# Patient Record
Sex: Female | Born: 1990 | Race: Black or African American | Hispanic: No | Marital: Single | Smoking: Never smoker
Health system: Southern US, Community
[De-identification: ages and names within clinical notes are randomized; demographics above are authoritative.]

## PROBLEM LIST (undated history)

## (undated) DIAGNOSIS — K219 Gastro-esophageal reflux disease without esophagitis: Secondary | ICD-10-CM

## (undated) DIAGNOSIS — F32A Depression, unspecified: Secondary | ICD-10-CM

## (undated) DIAGNOSIS — R519 Headache, unspecified: Secondary | ICD-10-CM

## (undated) DIAGNOSIS — N390 Urinary tract infection, site not specified: Secondary | ICD-10-CM

## (undated) DIAGNOSIS — F329 Major depressive disorder, single episode, unspecified: Secondary | ICD-10-CM

## (undated) DIAGNOSIS — G43909 Migraine, unspecified, not intractable, without status migrainosus: Secondary | ICD-10-CM

## (undated) DIAGNOSIS — L930 Discoid lupus erythematosus: Secondary | ICD-10-CM

## (undated) DIAGNOSIS — R51 Headache: Secondary | ICD-10-CM

## (undated) DIAGNOSIS — R7689 Other specified abnormal immunological findings in serum: Secondary | ICD-10-CM

## (undated) DIAGNOSIS — J45909 Unspecified asthma, uncomplicated: Secondary | ICD-10-CM

## (undated) HISTORY — DX: Headache, unspecified: R51.9

## (undated) HISTORY — DX: Urinary tract infection, site not specified: N39.0

## (undated) HISTORY — DX: Depression, unspecified: F32.A

## (undated) HISTORY — DX: Migraine, unspecified, not intractable, without status migrainosus: G43.909

## (undated) HISTORY — DX: Major depressive disorder, single episode, unspecified: F32.9

## (undated) HISTORY — DX: Gastro-esophageal reflux disease without esophagitis: K21.9

## (undated) HISTORY — DX: Headache: R51

## (undated) HISTORY — PX: SINOSCOPY: SHX187

---

## 2012-03-02 DIAGNOSIS — J4520 Mild intermittent asthma, uncomplicated: Secondary | ICD-10-CM | POA: Insufficient documentation

## 2012-03-02 DIAGNOSIS — J45909 Unspecified asthma, uncomplicated: Secondary | ICD-10-CM | POA: Insufficient documentation

## 2014-11-17 DIAGNOSIS — J4541 Moderate persistent asthma with (acute) exacerbation: Secondary | ICD-10-CM | POA: Insufficient documentation

## 2014-11-17 DIAGNOSIS — J309 Allergic rhinitis, unspecified: Secondary | ICD-10-CM | POA: Insufficient documentation

## 2014-11-17 DIAGNOSIS — H1010 Acute atopic conjunctivitis, unspecified eye: Secondary | ICD-10-CM | POA: Insufficient documentation

## 2018-03-09 HISTORY — PX: SINUS IRRIGATION: SHX2411

## 2018-08-07 ENCOUNTER — Encounter (HOSPITAL_COMMUNITY): Payer: Self-pay | Admitting: Emergency Medicine

## 2018-08-07 ENCOUNTER — Other Ambulatory Visit: Payer: Self-pay

## 2018-08-07 ENCOUNTER — Emergency Department (HOSPITAL_COMMUNITY): Payer: Self-pay

## 2018-08-07 ENCOUNTER — Emergency Department (HOSPITAL_COMMUNITY)
Admission: EM | Admit: 2018-08-07 | Discharge: 2018-08-07 | Disposition: A | Discharge status: Home / Self Care | Payer: Self-pay | Attending: Emergency Medicine | Admitting: Emergency Medicine

## 2018-08-07 DIAGNOSIS — J189 Pneumonia, unspecified organism: Secondary | ICD-10-CM | POA: Insufficient documentation

## 2018-08-07 DIAGNOSIS — J181 Lobar pneumonia, unspecified organism: Secondary | ICD-10-CM

## 2018-08-07 DIAGNOSIS — J45909 Unspecified asthma, uncomplicated: Secondary | ICD-10-CM | POA: Insufficient documentation

## 2018-08-07 HISTORY — DX: Unspecified asthma, uncomplicated: J45.909

## 2018-08-07 LAB — POC URINE PREG, ED: Preg Test, Ur: NEGATIVE

## 2018-08-07 LAB — INFLUENZA PANEL BY PCR (TYPE A & B)
Influenza A By PCR: NEGATIVE
Influenza B By PCR: NEGATIVE

## 2018-08-07 MED ORDER — DOXYCYCLINE HYCLATE 100 MG PO CAPS: 100.0000 mg | ORAL_CAPSULE | Freq: Two times a day (BID) | ORAL | 0 refills | Status: DC

## 2018-08-07 NOTE — ED Provider Notes (Signed)
[facility] EMERGENCY DEPARTMENT Provider Note   CSN: [id number] Arrival date & time: 08/07/18  7096    History   Chief Complaint Chief Complaint  Patient presents with  . Cough  . Fever    HPI Allison Daniel is a 28 y.o. female.  Who presents emergency department chief complaint of fever and cough.  Patient states that she had a productive cough and URI symptoms last week with associated chest congestion, green phlegm production from coughing, nasal congestion, fatigue.  She states that her symptoms improved however yesterday at the end of the day she began feeling shakes, chills, or cough came back and she has a productive cough again.  She was febrile today with associated body aches.  She denies urinary symptoms abdominal pain nausea vomiting.  She works at an [employer] and states that she is exposed to people who have upper respiratory symptoms all the time right now.     HPI  Past Medical History:  Diagnosis Date  . Asthma     There are no active problems to display for this patient.   Past Surgical History:  Procedure Laterality Date  . SINUS IRRIGATION  03/2018   drained mucous pockets and fixed septum      OB History   No obstetric history on file.      Home Medications    Prior to Admission medications   Not on File    Family History History reviewed. No pertinent family history.  Social History Social History   Tobacco Use  . Smoking status: Never Smoker  . Smokeless tobacco: Never Used  Substance Use Topics  . Alcohol use: Yes    Comment: occasionally  . Drug use: Never     Allergies   Cephalexin   Review of Systems Review of Systems  Ten systems reviewed and are negative for acute change, except as noted in the HPI.   Physical Exam Updated Vital Signs BP 129/81   Pulse (!) 122   Temp 100.2 F (37.9 C) (Oral)   Resp 18   LMP 07/04/2018 (Approximate)   SpO2 97%   Physical Exam Vitals signs and  nursing note reviewed.  Constitutional:      General: She is not in acute distress.    Appearance: She is well-developed. She is not diaphoretic.  HENT:     Head: Normocephalic and atraumatic.  Eyes:     General: No scleral icterus.    Conjunctiva/sclera: Conjunctivae normal.  Neck:     Musculoskeletal: Normal range of motion.  Cardiovascular:     Rate and Rhythm: Normal rate and regular rhythm.     Heart sounds: Normal heart sounds. No murmur. No friction rub. No gallop.   Pulmonary:     Effort: Pulmonary effort is normal. No respiratory distress.     Breath sounds: Normal breath sounds.  Abdominal:     General: Bowel sounds are normal. There is no distension.     Palpations: Abdomen is soft. There is no mass.     Tenderness: There is no abdominal tenderness. There is no guarding.  Skin:    General: Skin is warm and dry.  Neurological:     Mental Status: She is alert and oriented to person, place, and time.  Psychiatric:        Behavior: Behavior normal.      ED Treatments / Results  Labs (all labs ordered are listed, but only abnormal results are displayed) Labs Reviewed  INFLUENZA PANEL  BY PCR (TYPE A & B)  POC URINE PREG, ED    EKG None  Radiology No results found.  Procedures Procedures (including critical care time)  Medications Ordered in ED Medications - No data to display   Initial Impression / Assessment and Plan / ED Course  I have reviewed the triage vital signs and the nursing notes.  Pertinent labs & imaging results that were available during my care of the patient were reviewed by me and considered in my medical decision making (see chart for details).        Patient has been diagnosed with CAP via chest xray. Pt is not ill appearing, immunocompromised, and does not have multiple co morbidities, therefore I feel like the they can be treated as an OP with abx therapy. Pt has been advised to return to the ED if symptoms worsen or they do not  improve. Pt verbalizes understanding and is agreeable with plan.    Final Clinical Impressions(s) / ED Diagnoses   Final diagnoses:  Community acquired pneumonia of left lower lobe of lung [facility])    ED Discharge Orders    None       Arthor Captain, PA-C 08/07/18 2112    Rolan Bucco, MD 08/07/18 2129

## 2018-08-07 NOTE — Discharge Instructions (Addendum)
Contact a health care provider if: °You have a fever. °You are losing sleep because you cannot control your cough with cough medicine. °Get help right away if: °You have worsening shortness of breath. °You have increased chest pain. °Your sickness becomes worse, especially if you are an older adult or have a weakened immune system. °You cough up blood. °

## 2018-08-07 NOTE — ED Notes (Signed)
Patient transported to X-ray 

## 2018-08-07 NOTE — ED Triage Notes (Addendum)
Pt reports a cold last week with green mucous, states she has been coughing ever since but that she started feeling bad again last night and has been having a 100.1 fever. Reports left lower abdominal pain that started this morning, increased urination (no pain with urination) and a sharp pain when she takes a deep breath.  Pt took delsym, 500 mg tylenol 1530.

## 2018-08-07 NOTE — ED Notes (Signed)
Patient verbalizes understanding of discharge instructions. Opportunity for questioning and answers were provided. Armband removed by staff, pt discharged from ED.  

## 2018-08-26 ENCOUNTER — Other Ambulatory Visit: Payer: Self-pay

## 2018-08-26 ENCOUNTER — Encounter: Payer: Self-pay | Admitting: Family Medicine

## 2018-08-26 ENCOUNTER — Ambulatory Visit: Payer: BC Managed Care – PPO | Admitting: Family Medicine

## 2018-08-26 VITALS — BP 98/70 | HR 90 | Temp 98.4°F | Ht 66.0 in | Wt 211.0 lb

## 2018-08-26 DIAGNOSIS — Z7689 Persons encountering health services in other specified circumstances: Secondary | ICD-10-CM | POA: Diagnosis not present

## 2018-08-26 DIAGNOSIS — J189 Pneumonia, unspecified organism: Secondary | ICD-10-CM

## 2018-08-26 DIAGNOSIS — J181 Lobar pneumonia, unspecified organism: Secondary | ICD-10-CM | POA: Diagnosis not present

## 2018-08-26 DIAGNOSIS — J4540 Moderate persistent asthma, uncomplicated: Secondary | ICD-10-CM | POA: Diagnosis not present

## 2018-08-26 DIAGNOSIS — J302 Other seasonal allergic rhinitis: Secondary | ICD-10-CM | POA: Diagnosis not present

## 2018-08-26 MED ORDER — ALBUTEROL SULFATE HFA 108 (90 BASE) MCG/ACT IN AERS: 2.0000 | INHALATION_SPRAY | Freq: Four times a day (QID) | RESPIRATORY_TRACT | 11 refills | Status: DC | PRN

## 2018-08-26 MED ORDER — BUDESONIDE-FORMOTEROL FUMARATE 80-4.5 MCG/ACT IN AERO: 2.0000 | INHALATION_SPRAY | Freq: Two times a day (BID) | RESPIRATORY_TRACT | 11 refills | Status: DC

## 2018-08-26 NOTE — Progress Notes (Signed)
Patient presents to clinic today to establish care.  SUBJECTIVE: PMH: Pt is a 28 yo female with pmh sig for asthma, GERD, H/O depression, migraines.  Pt previously seen in [place], [place] at [facility] by [facility].  CAP: -ED visit for pneumonia on 2/29 -given doxycycline -endorses continued cough, chest muscle soreness, fatigue -denies fever, chills  Asthma: -h/o since childhood -using symbicort inhaler BID.  Has albuterol rescue inhaler -denies current wheezing  Seasonal allergies: -takes Zyrtec prn -Also uses Nettie pot  Allergies: Cephalexin- rash  Past surgical history: Sinus surgery in October 2019  Social history: Patient is single.  Pt has a masters degree.  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  Patient denies alcohol, tobacco, drug use.   Health Maintenance: Immunizations --TB test 2020 PAP -- 12/2017 LMP--08/13/2018  Family medical hx: Mom-Alive, HLD, HTN Dad-alive MGM-deceased, alcohol abuse, drug abuse PGM-alive, arthritis, diabetes   Past Medical History:  Diagnosis Date  . Asthma     Past Surgical History:  Procedure Laterality Date  . SINUS IRRIGATION  03/2018   drained mucous pockets and fixed septum     Current Outpatient Medications on File Prior to Visit  Medication Sig Dispense Refill  . budesonide-formoterol (SYMBICORT) 80-4.5 MCG/ACT inhaler Inhale 2 puffs into the lungs 2 (two) times daily.    . cetirizine (ZYRTEC) 10 MG tablet Take 10 mg by mouth daily.    Maximino [place] IN Inhale into the lungs as needed.    . norgestimate-ethinyl estradiol (ORTHO-CYCLEN,SPRINTEC,PREVIFEM) 0.25-35 MG-MCG tablet Take 1 tablet by mouth daily.     No current facility-administered medications on file prior to visit.     Allergies  Allergen Reactions  . Cephalexin Rash    History reviewed. No pertinent family history.  Social History   Socioeconomic History  .  Marital status: Single    Spouse name: Not on file  . Number of children: Not on file  . Years of education: Not on file  . Highest education level: Not on file  Occupational History  . Not on file  Social Needs  . Financial resource strain: Not on file  . Food insecurity:    Worry: Not on file    Inability: Not on file  . Transportation needs:    Medical: Not on file    Non-medical: Not on file  Tobacco Use  . Smoking status: Never Smoker  . Smokeless tobacco: Never Used  Substance and Sexual Activity  . Alcohol use: Yes    Comment: occasionally  . Drug use: Never  . Sexual activity: Yes  Lifestyle  . Physical activity:    Days per week: Not on file    Minutes per session: Not on file  . Stress: Not on file  Relationships  . Social connections:    Talks on phone: Not on file    Gets together: Not on file    Attends religious service: Not on file    Active member of club or organization: Not on file    Attends meetings of clubs or organizations: Not on file    Relationship status: Not on file  . Intimate partner violence:    Fear of current or ex partner: Not on file    Emotionally abused: Not on file    Physically abused: Not on file    Forced sexual activity: Not on file  Other Topics Concern  . Not on file  Social  History Narrative  . Not on file    ROS General: Denies fever, chills, night sweats, changes in weight, changes in appetite HEENT: Denies headaches, ear pain, changes in vision, rhinorrhea, sore throat  + nasal congestion CV: Denies CP, palpitations, SOB, orthopnea Pulm: Denies SOB, wheezing  + cough productive and dry GI: Denies abdominal pain, nausea, vomiting, diarrhea, constipation GU: Denies dysuria, hematuria, frequency, vaginal discharge Msk: Denies muscle cramps, joint pains Neuro: Denies weakness, numbness, tingling Skin: Denies rashes, bruising Psych: Denies depression, anxiety, hallucinations  BP 98/70 (BP Location: Left Arm, Patient  Position: Sitting, Cuff Size: Large)   Pulse 90   Temp 98.4 F (36.9 C) (Oral)   Ht 5\' 6"  (1.676 m)   Wt 211 lb (95.7 kg)   LMP 08/15/2018 (Exact Date)   SpO2 98%   BMI 34.06 kg/m   Physical Exam Gen. Pleasant, well developed, well-nourished, in NAD HEENT - [place]/AT, PERRL, no scleral icterus, no nasal drainage, pharynx without erythema or exudate.  TMs full b/l. Lungs: no use of accessory muscles, CTAB, no wheezes, rales or rhonchi Cardiovascular: RRR, No r/g/m, no peripheral edema Neuro:  A&Ox3, CN II-XII intact, normal gait Skin:  Warm, dry, intact, no lesions  Recent Results (from the past 2160 hour(s))  POC urine preg, ED (not at [facility])     Status: None   Collection Time: 08/07/18  7:44 PM  Result Value Ref Range   Preg Test, Ur NEGATIVE NEGATIVE    Comment:        THE SENSITIVITY OF THIS METHODOLOGY IS >24 mIU/mL   Influenza panel by PCR (type A & B)     Status: None   Collection Time: 08/07/18  8:21 PM  Result Value Ref Range   Influenza A By PCR NEGATIVE NEGATIVE   Influenza B By PCR NEGATIVE NEGATIVE    Comment: (NOTE) The Xpert Xpress Flu assay is intended as an aid in the diagnosis of  influenza and should not be used as a sole basis for treatment.  This  assay is FDA approved for nasopharyngeal swab specimens only. Nasal  washings and aspirates are unacceptable for Xpert Xpress Flu testing. Performed at [facility], [address] [place], [place] [address]     Assessment/Plan: Moderate persistent asthma without complication  -given handout - Plan: budesonide-formoterol (SYMBICORT) 80-4.5 MCG/ACT inhaler, albuterol (PROVENTIL HFA;VENTOLIN HFA) 108 (90 Base) MCG/ACT inhaler  Pneumonia of left lower lobe due to infectious organism [facility]) -discussed likely disease course -abx complete.  Exam reassuring -continue supportive care for cough -given RTC or ED precautions  Encounter to establish care -We reviewed the PMH, PSH, FH, SH, Meds and Allergies.  -We provided refills for any medications we will prescribe as needed. -We addressed current concerns per orders and patient instructions. -We have asked for records for pertinent exams, studies, vaccines and notes from previous providers. -We have advised patient to follow up per instructions below.  Seasonal allergies -continue zyrtec and netti pot prn  F/u prn  Abbe Amsterdam, MD

## 2018-08-26 NOTE — Patient Instructions (Signed)
Asthma, Adult  Asthma is a long-term (chronic) condition that causes recurrent episodes in which the airways become tight and narrow. The airways are the passages that lead from the nose and mouth down into the lungs. Asthma episodes, also called asthma attacks, can cause coughing, wheezing, shortness of breath, and chest pain. The airways can also fill with mucus. During an attack, it can be difficult to breathe. Asthma attacks can range from minor to life threatening. Asthma cannot be cured, but medicines and lifestyle changes can help control it and treat acute attacks. What are the causes? This condition is believed to be caused by inherited (genetic) and environmental factors, but its exact cause is not known. There are many things that can bring on an asthma attack or make asthma symptoms worse (triggers). Asthma triggers are different for each person. Common triggers include:  Mold.  Dust.  Cigarette smoke.  Cockroaches.  Things that can cause allergy symptoms (allergens), such as animal dander or pollen from trees or grass.  Air pollutants such as household cleaners, wood smoke, smog, or chemical odors.  Cold air, weather changes, and winds (which increase molds and pollen in the air).  Strong emotional expressions such as crying or laughing hard.  Stress.  Certain medicines (such as aspirin) or types of medicines (such as beta-blockers).  Sulfites in foods and drinks. Foods and drinks that may contain sulfites include dried fruit, potato chips, and sparkling grape juice.  Infections or inflammatory conditions such as the flu, a cold, or inflammation of the nasal membranes (rhinitis).  Gastroesophageal reflux disease (GERD).  Exercise or strenuous activity. What are the signs or symptoms? Symptoms of this condition may occur right after asthma is triggered or many hours later. Symptoms include:  Wheezing. This can sound like whistling when you breathe.  Excessive  nighttime or early morning coughing.  Frequent or severe coughing with a common cold.  Chest tightness.  Shortness of breath.  Tiredness (fatigue) with minimal activity. How is this diagnosed? This condition is diagnosed based on:  Your medical history.  A physical exam.  Tests, which may include: ? Lung function studies and pulmonary studies (spirometry). These tests can evaluate the flow of air in your lungs. ? Allergy tests. ? Imaging tests, such as X-rays. How is this treated? There is no cure for this condition, but treatment can help control your symptoms. Treatment for asthma usually involves:  Identifying and avoiding your asthma triggers.  Using medicines to control your symptoms. Generally, two types of medicines are used to treat asthma: ? Controller medicines. These help prevent asthma symptoms from occurring. They are usually taken every day. ? Fast-acting reliever or rescue medicines. These quickly relieve asthma symptoms by widening the narrow and tight airways. They are used as needed and provide short-term relief.  Using supplemental oxygen. This may be needed during a severe episode.  Using other medicines, such as: ? Allergy medicines, such as antihistamines, if your asthma attacks are triggered by allergens. ? Immune medicines (immunomodulators). These are medicines that help control the immune system.  Creating an asthma action plan. An asthma action plan is a written plan for managing and treating your asthma attacks. This plan includes: ? A list of your asthma triggers and how to avoid them. ? Information about when medicines should be taken and when their dosage should be changed. ? Instructions about using a device called a peak flow meter. A peak flow meter measures how well the lungs are working and the   severity of your asthma. It helps you monitor your condition. Follow these instructions at home: Controlling your home environment Control your home  environment in the following ways to help avoid triggers and prevent asthma attacks:  Change your heating and air conditioning filter regularly.  Limit your use of fireplaces and wood stoves.  Get rid of pests (such as roaches and mice) and their droppings.  Throw away plants if you see mold on them.  Clean floors and dust surfaces regularly. Use unscented cleaning products.  Try to have someone else vacuum for you regularly. Stay out of rooms while they are being vacuumed and for a short while afterward. If you vacuum, use a dust mask from a hardware store, a double-layered or microfilter vacuum cleaner bag, or a vacuum cleaner with a HEPA filter.  Replace carpet with wood, tile, or vinyl flooring. Carpet can trap dander and dust.  Use allergy-proof pillows, mattress covers, and box spring covers.  Keep your bedroom a trigger-free room.  Avoid pets and keep windows closed when allergens are in the air.  Wash beddings every week in hot water and dry them in a dryer.  Use blankets that are made of polyester or cotton.  Clean bathrooms and kitchens with bleach. If possible, have someone repaint the walls in these rooms with mold-resistant paint. Stay out of the rooms that are being cleaned and painted.  Wash your hands often with soap and water. If soap and water are not available, use hand sanitizer.  Do not allow anyone to smoke in your home. General instructions  Take over-the-counter and prescription medicines only as told by your health care provider. ? Speak with your health care provider if you have questions about how or when to take the medicines. ? Make note if you are requiring more frequent dosages.  Do not use any products that contain nicotine or tobacco, such as cigarettes and e-cigarettes. If you need help quitting, ask your health care provider. Also, avoid being exposed to secondhand smoke.  Use a peak flow meter as told by your health care provider. Record and  keep track of the readings.  Understand and use the asthma action plan to help minimize, or stop an asthma attack, without needing to seek medical care.  Make sure you stay up to date on your yearly vaccinations as told by your health care provider. This may include vaccines for the flu and pneumonia.  Avoid outdoor activities when allergen counts are high and when air quality is low.  Wear a ski mask that covers your nose and mouth during outdoor winter activities. Exercise indoors on cold days if you can.  Warm up before exercising, and take time for a cool-down period after exercise.  Keep all follow-up visits as told by your health care provider. This is important. Where to find more information  For information about asthma, turn to the Centers for Disease Control and Prevention at www.cdc.gov/asthma/faqs.htm  For air quality information, turn to AirNow at https://airnow.gov/ Contact a health care provider if:  You have wheezing, shortness of breath, or a cough even while you are taking medicine to prevent attacks.  The mucus you cough up (sputum) is thicker than usual.  Your sputum changes from clear or white to yellow, green, gray, or bloody.  Your medicines are causing side effects, such as a rash, itching, swelling, or trouble breathing.  You need to use a reliever medicine more than 2-3 times a week.  Your peak flow reading   is still at 50-79% of your personal best after following your action plan for 1 hour.  You have a fever. Get help right away if:  You are getting worse and do not respond to treatment during an asthma attack.  You are short of breath when at rest or when doing very little physical activity.  You have difficulty eating, drinking, or talking.  You have chest pain or tightness.  You develop a fast heartbeat or palpitations.  You have a bluish color to your lips or fingernails.  You are light-headed or dizzy, or you faint.  Your peak flow  reading is less than 50% of your personal best.  You feel too tired to breathe normally. Summary  Asthma is a long-term (chronic) condition that causes recurrent episodes in which the airways become tight and narrow. These episodes can cause coughing, wheezing, shortness of breath, and chest pain.  Asthma cannot be cured, but medicines and lifestyle changes can help control it and treat acute attacks.  Make sure you understand how to avoid triggers and how and when to use your medicines.  Asthma attacks can range from minor to life threatening. Get help right away if you have an asthma attack and do not respond to treatment with your usual rescue medicines. This information is not intended to replace advice given to you by your health care provider. Make sure you discuss any questions you have with your health care provider. Document Released: 05/26/2005 Document Revised: 06/30/2016 Document Reviewed: 06/30/2016 Elsevier Interactive Patient Education  2019 [employer]  Community-Acquired Pneumonia, Adult Pneumonia is an infection of the lungs. There are different types of pneumonia. One type can develop while a person is in a hospital. A different type, called community-acquired pneumonia, develops in people who are not, or have not recently been, in the hospital or other health care facility. What are the causes?  Pneumonia may be caused by bacteria, viruses, or funguses. Community-acquired pneumonia is often caused by Streptococcus pneumonia bacteria. These bacteria are often passed from one person to another by breathing in droplets from the cough or sneeze of an infected person. What increases the risk? The condition is more likely to develop in:  People who havechronic diseases, such as chronic obstructive pulmonary disease (COPD), asthma, congestive heart failure, cystic fibrosis, diabetes, or kidney disease.  People who haveearly-stage or late-stage HIV.  People who  havesickle cell disease.  People who havehad their spleen removed (splenectomy).  People who havepoor Administrator.  People who havemedical conditions that increase the risk of breathing in (aspirating) secretions their own mouth and nose.  People who havea weakened immune system (immunocompromised).  People who smoke.  People whotravel to areas where pneumonia-causing germs commonly exist.  People whoare around animal habitats or animals that have pneumonia-causing germs, including birds, bats, rabbits, cats, and farm animals. What are the signs or symptoms? Symptoms of this condition include:  Adry cough.  A wet (productive) cough.  Fever.  Sweating.  Chest pain, especially when breathing deeply or coughing.  Rapid breathing or difficulty breathing.  Shortness of breath.  Shaking chills.  Fatigue.  Muscle aches. How is this diagnosed? Your health care provider will take a medical history and perform a physical exam. You may also have other tests, including:  Imaging studies of your chest, including X-rays.  Tests to check your blood oxygen level and other blood gases.  Other tests on blood, mucus (sputum), fluid around your lungs (pleural fluid), and urine. If your  pneumonia is severe, other tests may be done to identify the specific cause of your illness. How is this treated? The type of treatment that you receive depends on many factors, such as the cause of your pneumonia, the medicines you take, and other medical conditions that you have. For most adults, treatment and recovery from pneumonia may occur at home. In some cases, treatment must happen in a hospital. Treatment may include:  Antibiotic medicines, if the pneumonia was caused by bacteria.  Antiviral medicines, if the pneumonia was caused by a virus.  Medicines that are given by mouth or through an IV tube.  Oxygen.  Respiratory therapy. Although rare, treating severe pneumonia may  include:  Mechanical ventilation. This is done if you are not breathing well on your own and you cannot maintain a safe blood oxygen level.  Thoracentesis. This procedureremoves fluid around one lung or both lungs to help you breathe better. Follow these instructions at home:   Take over-the-counter and prescription medicines only as told by your health care provider. ? Only takecough medicine if you are losing sleep. Understand that cough medicine can prevent your body's natural ability to remove mucus from your lungs. ? If you were prescribed an antibiotic medicine, take it as told by your health care provider. Do not stop taking the antibiotic even if you start to feel better.  Sleep in a semi-upright position at night. Try sleeping in a reclining chair, or place a few pillows under your head.  Do not use tobacco products, including cigarettes, chewing tobacco, and e-cigarettes. If you need help quitting, ask your health care provider.  Drink enough water to keep your urine clear or pale yellow. This will help to thin out mucus secretions in your lungs. How is this prevented? There are ways that you can decrease your risk of developing community-acquired pneumonia. Consider getting a pneumococcal vaccine if:  You are older than 28 years of age.  You are older than 28 years of age and are undergoing cancer treatment, have chronic lung disease, or have other medical conditions that affect your immune system. Ask your health care provider if this applies to you. There are different types and schedules of pneumococcal vaccines. Ask your health care provider which vaccination option is best for you. You may also prevent community-acquired pneumonia if you take these actions:  Get an influenza vaccine every year. Ask your health care provider which type of influenza vaccine is best for you.  Go to the dentist on a regular basis.  Wash your hands often. Use hand sanitizer if soap and  water are not available. Contact a health care provider if:  You have a fever.  You are losing sleep because you cannot control your cough with cough medicine. Get help right away if:  You have worsening shortness of breath.  You have increased chest pain.  Your sickness becomes worse, especially if you are an older adult or have a weakened immune system.  You cough up blood. This information is not intended to replace advice given to you by your health care provider. Make sure you discuss any questions you have with your health care provider. Document Released: 05/26/2005 Document Revised: 02/12/2017 Document Reviewed: 09/20/2014 Elsevier Interactive Patient Education  2019 [employer].

## 2018-09-24 ENCOUNTER — Encounter: Payer: Self-pay | Admitting: Family Medicine

## 2018-09-24 ENCOUNTER — Ambulatory Visit (INDEPENDENT_AMBULATORY_CARE_PROVIDER_SITE_OTHER): Payer: BC Managed Care – PPO | Admitting: Family Medicine

## 2018-09-24 ENCOUNTER — Other Ambulatory Visit: Payer: Self-pay

## 2018-09-24 DIAGNOSIS — N760 Acute vaginitis: Secondary | ICD-10-CM

## 2018-09-24 MED ORDER — FLUCONAZOLE 150 MG PO TABS: 150.0000 mg | ORAL_TABLET | Freq: Once | ORAL | 0 refills | Status: AC

## 2018-09-24 NOTE — Progress Notes (Signed)
Virtual Visit via Video Note Pt did not have video enabled on webex.  I connected with Allison Daniel on 09/24/18 at 11:30 AM EDT by a video enabled telemedicine application and verified that I am speaking with the correct person using two identifiers.  Location patient: home Location provider: home office Persons participating in the virtual visit: patient, provider  I discussed the limitations of evaluation and management by telemedicine and the availability of in person appointments. The patient expressed understanding and agreed to proceed.   HPI: Noticed vaginal itching around her vulva 1 wk ago.  Thought it was 2/2 shaving.  Denies dysuria, frequency, urgency, vaginal d/c, lesions.  Tried a cortisone cream which helped some.  Pt states she did notice slight discoloration of her labia minora after using the cream.  Pt endorses unprotected intercourse with her longtime bf. Denies changes in soaps, lotions, detergents.  Pt has been exercising more, but has been showering immediately after.   ROS: See pertinent positives and negatives per HPI.  Past Medical History:  Diagnosis Date  . Asthma   . Depression   . Frequent headaches   . GERD (gastroesophageal reflux disease)   . Migraine   . UTI (urinary tract infection)     Past Surgical History:  Procedure Laterality Date  . SINUS IRRIGATION  03/2018   drained mucous pockets and fixed septum     Family History  Problem Relation Age of Onset  . Hyperlipidemia Mother   . Hypertension Mother   . Alcohol abuse Maternal Grandmother   . Drug abuse Maternal Grandmother   . Arthritis Paternal Grandmother   . Diabetes Paternal Grandmother     SOCIAL HX:    Current Outpatient Medications:  .  albuterol (PROVENTIL HFA;VENTOLIN HFA) 108 (90 Base) MCG/ACT inhaler, Inhale 2 puffs into the lungs every 6 (six) hours as needed for wheezing or shortness of breath., Disp: 1 Inhaler, Rfl: 11 .  budesonide-formoterol (SYMBICORT) 80-4.5  MCG/ACT inhaler, Inhale 2 puffs into the lungs 2 (two) times daily., Disp: 10.2 g, Rfl: 11 .  cetirizine (ZYRTEC) 10 MG tablet, Take 10 mg by mouth daily., Disp: , Rfl:  .  IPRATROPIUM-ALBUTEROL IN, Inhale into the lungs as needed., Disp: , Rfl:  .  norgestimate-ethinyl estradiol (ORTHO-CYCLEN,SPRINTEC,PREVIFEM) 0.25-35 MG-MCG tablet, Take 1 tablet by mouth daily., Disp: , Rfl:   EXAM:  VITALS per patient if applicable:  RR between 12-20 bpm  GENERAL: alert, oriented, appears well and in no acute distress  LUNGS: breathing rate appears normal, no obvious gross SOB, gasping or wheezing  PSYCH/NEURO: pleasant and cooperative, no obvious depression or anxiety, speech and thought processing grossly intact  ASSESSMENT AND PLAN:  Discussed the following assessment and plan:  Acute vaginitis  -discussed hygiene -continue to monitor - Plan: fluconazole (DIFLUCAN) 150 MG tablet  F/u prn   I discussed the assessment and treatment plan with the patient. The patient was provided an opportunity to ask questions and all were answered. The patient agreed with the plan and demonstrated an understanding of the instructions.   The patient was advised to call back or seek an in-person evaluation if the symptoms worsen or if the condition fails to improve as anticipated.  I provided 12 minutes of non-face-to-face time during this encounter.   Deeann Saint, MD

## 2018-10-14 ENCOUNTER — Ambulatory Visit (INDEPENDENT_AMBULATORY_CARE_PROVIDER_SITE_OTHER): Payer: BC Managed Care – PPO | Admitting: Family Medicine

## 2018-10-14 ENCOUNTER — Encounter: Payer: Self-pay | Admitting: Family Medicine

## 2018-10-14 ENCOUNTER — Other Ambulatory Visit: Payer: Self-pay

## 2018-10-14 DIAGNOSIS — N3000 Acute cystitis without hematuria: Secondary | ICD-10-CM

## 2018-10-14 MED ORDER — SULFAMETHOXAZOLE-TRIMETHOPRIM 800-160 MG PO TABS: 1.0000 | ORAL_TABLET | Freq: Two times a day (BID) | ORAL | 0 refills | Status: AC

## 2018-10-14 NOTE — Progress Notes (Signed)
Virtual Visit via Video Note  I connected with Allison Daniel on 10/14/18 at  3:30 PM EDT by a video enabled telemedicine application and verified that I am speaking with the correct person using two identifiers.  Location patient: home Location provider:work or home office Persons participating in the virtual visit: patient, provider  I discussed the limitations of evaluation and management by telemedicine and the availability of in person appointments. The patient expressed understanding and agreed to proceed.   HPI: Pt endorses strong odor to urine, low back pain, lower abdominal pain, and frequency x 2 wks.  Had chills yesterday.  Denies fever, urgency, vaginal d/c, constipation, n/v, recent sexual intercourse.  LMP was last wk.  Pt started taking a probiotic, cranberry juice, and increasing intake of water to 3-4 bottles per day.  Pt on Ortho-cyclen OCPs.   ROS: See pertinent positives and negatives per HPI.  Past Medical History:  Diagnosis Date  . Asthma   . Depression   . Frequent headaches   . GERD (gastroesophageal reflux disease)   . Migraine   . UTI (urinary tract infection)     Past Surgical History:  Procedure Laterality Date  . SINUS IRRIGATION  03/2018   drained mucous pockets and fixed septum     Family History  Problem Relation Age of Onset  . Hyperlipidemia Mother   . Hypertension Mother   . Alcohol abuse Maternal Grandmother   . Drug abuse Maternal Grandmother   . Arthritis Paternal Grandmother   . Diabetes Paternal Grandmother     SOCIAL HX:    Current Outpatient Medications:  .  albuterol (PROVENTIL HFA;VENTOLIN HFA) 108 (90 Base) MCG/ACT inhaler, Inhale 2 puffs into the lungs every 6 (six) hours as needed for wheezing or shortness of breath., Disp: 1 Inhaler, Rfl: 11 .  budesonide-formoterol (SYMBICORT) 80-4.5 MCG/ACT inhaler, Inhale 2 puffs into the lungs 2 (two) times daily., Disp: 10.2 g, Rfl: 11 .  cetirizine (ZYRTEC) 10 MG tablet, Take 10  mg by mouth daily., Disp: , Rfl:  .  IPRATROPIUM-ALBUTEROL IN, Inhale into the lungs as needed., Disp: , Rfl:  .  norgestimate-ethinyl estradiol (ORTHO-CYCLEN,SPRINTEC,PREVIFEM) 0.25-35 MG-MCG tablet, Take 1 tablet by mouth daily., Disp: , Rfl:   EXAM:  VITALS per patient if applicable: RR between 12-20 bpm, afebrile.  GENERAL: alert, oriented, appears well and in no acute distress  HEENT: atraumatic, conjunctiva clear, no obvious abnormalities on inspection of external nose and ears  NECK: normal movements of the head and neck  LUNGS: on inspection no signs of respiratory distress, breathing rate appears normal, no obvious gross SOB, gasping or wheezing  CV: no obvious cyanosis  MS: moves all visible extremities without noticeable abnormality  PSYCH/NEURO: pleasant and cooperative, no obvious depression or anxiety, speech and thought processing grossly intact  ASSESSMENT AND PLAN:  Discussed the following assessment and plan:  Acute cystitis without hematuria  -given symptoms suspect UTI.  Unable to obtain urine sample at this time.  Discussed limitation of not obtaining UA and UCx. -encouraged to increase intake of water and fluids. -h/o cephalexin allergy-rash -will start Bactrim.  Pt to monitor for S/Es. - Plan: sulfamethoxazole-trimethoprim (BACTRIM DS) 800-160 MG tablet -given precautions -f/u prn   I discussed the assessment and treatment plan with the patient. The patient was provided an opportunity to ask questions and all were answered. The patient agreed with the plan and demonstrated an understanding of the instructions.   The patient was advised to call back or seek  an in-person evaluation if the symptoms worsen or if the condition fails to improve as anticipated.  I provided 11 minutes of non-face-to-face time during this encounter.   Deeann Saint, MD

## 2018-10-25 ENCOUNTER — Ambulatory Visit: Payer: BC Managed Care – PPO | Admitting: Family Medicine

## 2018-10-27 ENCOUNTER — Other Ambulatory Visit (HOSPITAL_COMMUNITY)
Admission: RE | Admit: 2018-10-27 | Discharge: 2018-10-27 | Disposition: A | Discharge status: Home / Self Care | Payer: BC Managed Care – PPO | Source: Ambulatory Visit | Attending: Family Medicine | Admitting: Family Medicine

## 2018-10-27 ENCOUNTER — Ambulatory Visit: Payer: BC Managed Care – PPO | Admitting: Family Medicine

## 2018-10-27 ENCOUNTER — Encounter: Payer: Self-pay | Admitting: Family Medicine

## 2018-10-27 ENCOUNTER — Other Ambulatory Visit: Payer: Self-pay

## 2018-10-27 VITALS — BP 108/78 | HR 105 | Temp 98.2°F | Wt 217.0 lb

## 2018-10-27 DIAGNOSIS — N898 Other specified noninflammatory disorders of vagina: Secondary | ICD-10-CM

## 2018-10-27 DIAGNOSIS — S30814A Abrasion of vagina and vulva, initial encounter: Secondary | ICD-10-CM

## 2018-10-27 NOTE — Progress Notes (Signed)
Subjective:    Patient ID: Allison Daniel, female    DOB: Sep 07, 1990, 28 y.o.   MRN: [id number]  No chief complaint on file.   HPI Patient seen today for acute concern.  Pt seen 4/17 for vaginitis and 5/7 for UTI.  Pt completed diflucan and bactrim respectively.  Pt notes vaginal discomfort to the L of clitoris.  Pt with increase irritation if not using monistat cream or if urinates.  Pt denies vaginal d/c, bumps, tingling, burning, itching, or odor.  Past Medical History:  Diagnosis Date  . Asthma   . Depression   . Frequent headaches   . GERD (gastroesophageal reflux disease)   . Migraine   . UTI (urinary tract infection)     Allergies  Allergen Reactions  . Cephalexin Rash    ROS General: Denies fever, chills, night sweats, changes in weight, changes in appetite HEENT: Denies headaches, ear pain, changes in vision, rhinorrhea, sore throat CV: Denies CP, palpitations, SOB, orthopnea Pulm: Denies SOB, cough, wheezing GI: Denies abdominal pain, nausea, vomiting, diarrhea, constipation GU: Denies dysuria, hematuria, frequency, vaginal discharge  +vaginal discomfort Msk: Denies muscle cramps, joint pains Neuro: Denies weakness, numbness, tingling Skin: Denies rashes, bruising Psych: Denies depression, anxiety, hallucinations      Objective:    Blood pressure 108/78, pulse (!) 105, temperature 98.2 F (36.8 C), temperature source Oral, weight 217 lb (98.4 kg), last menstrual period 10/08/2018, SpO2 98 %.  Gen. Pleasant, well-nourished, in no distress, normal affect   HEENT: [place]/AT, face symmetric, conjunctiva clear, no scleral icterus, PERRLA, nares patent without drainage Lungs: no accessory muscle use Cardiovascular: RRR, no peripheral edema GU: Upon visual inspection: healing, small abrasion of L labia majora near clitoris, otherwise normal external female genitalia.  No vaginal d/c, normal appearing rectum, perineum, and clitoris.  Aptima swab collected Neuro:   A&Ox3, CN II-XII intact, normal gait   Wt Readings from Last 3 Encounters:  10/27/18 217 lb (98.4 kg)  08/26/18 211 lb (95.7 kg)  08/07/18 200 lb (90.7 kg)    No results found for: WBC, HGB, HCT, PLT, GLUCOSE, CHOL, TRIG, HDL, LDLDIRECT, LDLCALC, ALT, AST, NA, K, CL, CREATININE, BUN, CO2, TSH, PSA, INR, GLUF, HGBA1C, MICROALBUR  Assessment/Plan:  Vaginal irritation  - Plan: Cervicovaginal ancillary only  Abrasion of vagina, initial encounter -healing -advised to keep area clean and dry. -consider using A&D ointment as a barrier  F/u prn  Abbe Amsterdam, MD

## 2018-10-28 ENCOUNTER — Encounter: Payer: Self-pay | Admitting: Family Medicine

## 2018-10-28 LAB — CERVICOVAGINAL ANCILLARY ONLY
Bacterial vaginitis: NEGATIVE
Candida vaginitis: NEGATIVE
Chlamydia: NEGATIVE
Neisseria Gonorrhea: NEGATIVE
Trichomonas: NEGATIVE

## 2019-01-26 ENCOUNTER — Other Ambulatory Visit: Payer: Self-pay | Admitting: Family Medicine

## 2019-01-26 DIAGNOSIS — N760 Acute vaginitis: Secondary | ICD-10-CM

## 2019-01-28 NOTE — Telephone Encounter (Signed)
Spoke with pt states that she will try over the counter cream and if not getting better pt will schedule an appointment with Dr Volanda Napoleon

## 2019-02-02 ENCOUNTER — Encounter: Payer: Self-pay | Admitting: Family Medicine

## 2019-02-04 ENCOUNTER — Telehealth: Payer: BC Managed Care – PPO | Admitting: Family Medicine

## 2019-02-08 NOTE — Telephone Encounter (Signed)
Pt appointment  was cancelled 

## 2019-03-08 ENCOUNTER — Telehealth: Payer: BC Managed Care – PPO | Admitting: Physician Assistant

## 2019-03-08 ENCOUNTER — Other Ambulatory Visit: Payer: Self-pay

## 2019-03-08 ENCOUNTER — Telehealth (INDEPENDENT_AMBULATORY_CARE_PROVIDER_SITE_OTHER): Payer: BC Managed Care – PPO | Admitting: Family Medicine

## 2019-03-08 DIAGNOSIS — B379 Candidiasis, unspecified: Secondary | ICD-10-CM

## 2019-03-08 DIAGNOSIS — N3000 Acute cystitis without hematuria: Secondary | ICD-10-CM

## 2019-03-08 DIAGNOSIS — T3695XA Adverse effect of unspecified systemic antibiotic, initial encounter: Secondary | ICD-10-CM | POA: Diagnosis not present

## 2019-03-08 DIAGNOSIS — R350 Frequency of micturition: Secondary | ICD-10-CM

## 2019-03-08 DIAGNOSIS — R399 Unspecified symptoms and signs involving the genitourinary system: Secondary | ICD-10-CM

## 2019-03-08 LAB — POC URINALSYSI DIPSTICK (AUTOMATED)
Glucose, UA: NEGATIVE
Protein, UA: NEGATIVE
Spec Grav, UA: 1.02 (ref 1.010–1.025)
Urobilinogen, UA: NEGATIVE E.U./dL — AB
pH, UA: 7 (ref 5.0–8.0)

## 2019-03-08 MED ORDER — FLUCONAZOLE 150 MG PO TABS: 150.0000 mg | ORAL_TABLET | Freq: Once | ORAL | 0 refills | Status: AC

## 2019-03-08 MED ORDER — SULFAMETHOXAZOLE-TRIMETHOPRIM 800-160 MG PO TABS: 1.0000 | ORAL_TABLET | Freq: Two times a day (BID) | ORAL | 0 refills | Status: AC

## 2019-03-08 NOTE — Progress Notes (Signed)
Virtual Visit via Video Note  I connected with Allison Daniel on 03/08/19 at  3:30 PM EDT by a video enabled telemedicine application 2/2 [id number] pandemic and verified that I am speaking with the correct person using two identifiers.  Location patient: home Location provider:work or home office Persons participating in the virtual visit: patient, provider  I discussed the limitations of evaluation and management by telemedicine and the availability of in person appointments. The patient expressed understanding and agreed to proceed.   HPI: Pt had abdominal pain and back pain and foul smelling urine, seen at UC a few wks ago.  Pt told she had "a severe UTI" in the office.   Started on Cipro 500 mg on 8/26.  Later told to stop the abx after UCx was negative.  Since that visit, pt notes urinary frequency and dull pain in abdomen that started this wknd.  Pt unsure if her menses was contributing to abdominal pain. Pt denies fever, chills, dysuria, N/V, recent sexual activity, changes in soaps.  ROS: See pertinent positives and negatives per HPI.  Past Medical History:  Diagnosis Date  . Asthma   . Depression   . Frequent headaches   . GERD (gastroesophageal reflux disease)   . Migraine   . UTI (urinary tract infection)     Past Surgical History:  Procedure Laterality Date  . SINUS IRRIGATION  03/2018   drained mucous pockets and fixed septum     Family History  Problem Relation Age of Onset  . Hyperlipidemia Mother   . Hypertension Mother   . Alcohol abuse Maternal Grandmother   . Drug abuse Maternal Grandmother   . Arthritis Paternal Grandmother   . Diabetes Paternal Grandmother     Current Outpatient Medications:  .  albuterol (PROVENTIL HFA;VENTOLIN HFA) 108 (90 Base) MCG/ACT inhaler, Inhale 2 puffs into the lungs every 6 (six) hours as needed for wheezing or shortness of breath., Disp: 1 Inhaler, Rfl: 11 .  budesonide-formoterol (SYMBICORT) 80-4.5 MCG/ACT inhaler, Inhale 2  puffs into the lungs 2 (two) times daily., Disp: 10.2 g, Rfl: 11 .  cetirizine (ZYRTEC) 10 MG tablet, Take 10 mg by mouth daily., Disp: , Rfl:  .  IPRATROPIUM-ALBUTEROL IN, Inhale into the lungs as needed., Disp: , Rfl:  .  norgestimate-ethinyl estradiol (ORTHO-CYCLEN,SPRINTEC,PREVIFEM) 0.25-35 MG-MCG tablet, Take 1 tablet by mouth daily., Disp: , Rfl:   EXAM:  VITALS per patient if applicable:  GENERAL: alert, oriented, appears well and in no acute distress  HEENT: atraumatic, conjunctiva clear, no obvious abnormalities on inspection of external nose and ears  NECK: normal movements of the head and neck  LUNGS: on inspection no signs of respiratory distress, breathing rate appears normal, no obvious gross SOB, gasping or wheezing  CV: no obvious cyanosis  MS: moves all visible extremities without noticeable abnormality  PSYCH/NEURO: pleasant and cooperative, no obvious depression or anxiety, speech and thought processing grossly intact  ASSESSMENT AND PLAN:  Discussed the following assessment and plan:  Acute cystitis without hematuria  -UA with 2+ leuks, SG 1.020 -will send for UCx - Plan: sulfamethoxazole-trimethoprim (BACTRIM DS) 800-160 MG tablet -given precautions  Urinary frequency  - Plan: POCT Urinalysis Dipstick (Automated), Urine Culture  Antibiotic-induced yeast infection  - Plan: fluconazole (DIFLUCAN) 150 MG tablet  F/u prn   I discussed the assessment and treatment plan with the patient. The patient was provided an opportunity to ask questions and all were answered. The patient agreed with the plan and demonstrated an understanding of  the instructions.   The patient was advised to call back or seek an in-person evaluation if the symptoms worsen or if the condition fails to improve as anticipated.   Deeann Saint, MD

## 2019-03-08 NOTE — Progress Notes (Signed)
Based on what you shared with me, I feel your condition warrants further evaluation and I recommend that you be seen for a face to face office visit.  NOTE: If you entered your credit card information for this eVisit, you will not be charged. You may see a "hold" on your card for the $35 but that hold will drop off and you will not have a charge processed.  If you are having a true medical emergency please call 911.     For an urgent face to face visit, [facility] has four urgent care centers for your convenience:   . [facility]    [phone]                  Get Driving Directions  [address] [place], [place] [address] . 10 am to 8 pm Monday-Friday . 12 pm to 8 pm Saturday-Sunday   . [facility]  [phone]                  Get Driving Directions  [address], [address] [place], [place] [address] . 8 am to 8 pm Monday-Friday . 9 am to 6 pm Saturday . 11 am to 6 pm Sunday   . [facility] [place]  [phone]                  Get Driving Directions   [address] [place], [place] [address] . 8 am to 8 pm Monday-Friday . 8 am to 4 pm Saturday-Sunday    .  [facility] at [facility]                    Get Driving Directions  [phone]  [address] [place], [place] [address]  . Monday-Friday, 12 PM to 6 PM    Your e-visit answers were reviewed by a board certified advanced clinical practitioner to complete your personal care plan.  Thank you for using e-Visits.  

## 2019-03-10 ENCOUNTER — Encounter: Payer: Self-pay | Admitting: Family Medicine

## 2019-03-10 LAB — URINE CULTURE
MICRO NUMBER:: 934273
SPECIMEN QUALITY:: ADEQUATE

## 2019-03-16 DIAGNOSIS — R87619 Unspecified abnormal cytological findings in specimens from cervix uteri: Secondary | ICD-10-CM | POA: Insufficient documentation

## 2019-03-22 ENCOUNTER — Telehealth: Payer: Self-pay

## 2019-03-22 NOTE — Telephone Encounter (Signed)
Copied from [place] [id number]. Topic: General - Other >> Mar 14, 2019  8:44 AM Lennox Solders wrote: Reason for CRM: please review mychart message pt sent on 03-10-2019. Pt needs medical documentation for her asthma

## 2019-07-19 ENCOUNTER — Ambulatory Visit
Admission: EM | Admit: 2019-07-19 | Discharge: 2019-07-19 | Disposition: A | Discharge status: Home / Self Care | Payer: BC Managed Care – PPO | Attending: Emergency Medicine | Admitting: Emergency Medicine

## 2019-07-19 ENCOUNTER — Encounter: Payer: Self-pay | Admitting: Emergency Medicine

## 2019-07-19 ENCOUNTER — Other Ambulatory Visit: Payer: Self-pay

## 2019-07-19 DIAGNOSIS — R0789 Other chest pain: Secondary | ICD-10-CM | POA: Diagnosis not present

## 2019-07-19 NOTE — Discharge Instructions (Addendum)
Keep symptom log & follow up with PCP in 1-2 weeks.

## 2019-07-19 NOTE — ED Triage Notes (Signed)
Pt presents to [facility] for assessment after yesterday during a meeting at work she had an episode of tachycardia and heart pounding hard that lasted approx 2 minutes.  States then last night when she was laying in bed getting ready to go to sleep she began to feel like her lungs were getting tight and she felt like she couldn't take a deep breath in.  States she took her inhaler and went to sleep, and all night while she was sleeping she felt a heaviness on her chest.  States she had an episode yesterday morning and this morning of chest pain to left and right side of chest with inspiration.  Patient endorses chest pain with inspiration at this time.

## 2019-07-19 NOTE — ED Provider Notes (Signed)
[facility]    CSN: [id number] Arrival date & time: 07/19/19  0846      History   Chief Complaint Chief Complaint  Patient presents with  . Shortness of Breath    HPI Allison Daniel is a 29 y.o. female with history of asthma, GERD presenting for episode of chest discomfort, rapid heart rate, difficulty taking full breath that occurred last night.  Lasted for about 2 minutes without inciting event.  Did not take anything to relieve symptoms.  Patient states this did happen once about a year ago, though did not last as long.  Patient does have history of asthma and allergies: Reports compliance with twice daily Symbicort, nightly nasal.  Tried using inhaler last night which helped a little bit, though did not completely resolve symptoms.  Patient states she was fine at work yesterday, though began to feel little bit of discomfort when going to work this morning.  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  Patient denies history of DVT/PE: No leg swelling, OCP, tobacco use.  Patient does endorse history of anxiety and depression, for which she is been stable on 50 mg daily of Zoloft since this summer.   Past Medical History:  Diagnosis Date  . Asthma   . Depression   . Frequent headaches   . GERD (gastroesophageal reflux disease)   . Migraine   . UTI (urinary tract infection)     There are no problems to display for this patient.   Past Surgical History:  Procedure Laterality Date  . SINUS IRRIGATION  03/2018   drained mucous pockets and fixed septum     OB History   No obstetric history on file.      Home Medications    Prior to Admission medications   Medication Sig Start Date End Date Taking? Authorizing Provider  sertraline (ZOLOFT) 50 MG tablet Take 50 mg by mouth daily.   Yes [provider]  albuterol (PROVENTIL HFA;VENTOLIN HFA) 108 (90 Base) MCG/ACT inhaler Inhale 2 puffs into the lungs every 6 (six)  hours as needed for wheezing or shortness of breath. 08/26/18   Deeann Saint, MD  budesonide-formoterol (SYMBICORT) 80-4.5 MCG/ACT inhaler Inhale 2 puffs into the lungs 2 (two) times daily. 08/26/18   Deeann Saint, MD  cetirizine (ZYRTEC) 10 MG tablet Take 10 mg by mouth daily.    [provider]  IPRATROPIUM-ALBUTEROL IN Inhale into the lungs as needed.    [provider]  norgestimate-ethinyl estradiol (ORTHO-CYCLEN,SPRINTEC,PREVIFEM) 0.25-35 MG-MCG tablet Take 1 tablet by mouth daily.    [provider]    Family History Family History  Problem Relation Age of Onset  . Hyperlipidemia Mother   . Hypertension Mother   . Alcohol abuse Maternal Grandmother   . Drug abuse Maternal Grandmother   . Arthritis Paternal Grandmother   . Diabetes Paternal Grandmother     Social History Social History   Tobacco Use  . Smoking status: Never Smoker  . Smokeless tobacco: Never Used  Substance Use Topics  . Alcohol use: Yes    Comment: occasionally  . Drug use: Never     Allergies   Cephalexin   Review of Systems As per HPI   Physical Exam Triage Vital Signs ED Triage Vitals  Enc Vitals Group     BP      Pulse      Resp      Temp  Temp src      SpO2      Weight      Height      Head Circumference      Peak Flow      Pain Score      Pain Loc      Pain Edu?      Excl. in GC?    No data found.  Updated Vital Signs BP 125/85 (BP Location: Left Arm)   Pulse 73   Temp 98.2 F (36.8 C) (Temporal)   Resp 16   LMP 06/18/2019   SpO2 98%   Visual Acuity Right Eye Distance:   Left Eye Distance:   Bilateral Distance:    Right Eye Near:   Left Eye Near:    Bilateral Near:     Physical Exam Constitutional:      General: She is not in acute distress.    Appearance: She is obese. She is not ill-appearing or diaphoretic.  HENT:     Head: Normocephalic and atraumatic.     Mouth/Throat:     Mouth: Mucous membranes are moist.      Pharynx: Oropharynx is clear. No oropharyngeal exudate or posterior oropharyngeal erythema.  Eyes:     General: No scleral icterus.    Conjunctiva/sclera: Conjunctivae normal.     Pupils: Pupils are equal, round, and reactive to light.  Neck:     Comments: Trachea midline, negative JVD Cardiovascular:     Rate and Rhythm: Normal rate and regular rhythm.     Heart sounds: No murmur. No gallop.   Pulmonary:     Effort: Pulmonary effort is normal. No respiratory distress.     Breath sounds: No wheezing, rhonchi or rales.  Musculoskeletal:     Cervical back: Neck supple. No tenderness.  Lymphadenopathy:     Cervical: No cervical adenopathy.  Skin:    Capillary Refill: Capillary refill takes less than 2 seconds.     Coloration: Skin is not jaundiced or pale.     Findings: No rash.  Neurological:     General: No focal deficit present.     Mental Status: She is alert and oriented to person, place, and time.      UC Treatments / Results  Labs (all labs ordered are listed, but only abnormal results are displayed) Labs Reviewed - No data to display  EKG   Radiology No results found.  Procedures Procedures (including critical care time)  Medications Ordered in UC Medications - No data to display  Initial Impression / Assessment and Plan / UC Course  I have reviewed the triage vital signs and the nursing notes.  Pertinent labs & imaging results that were available during my care of the patient were reviewed by me and considered in my medical decision making (see chart for details).     Patient afebrile, nontoxic, with SpO2 98%.  EKG done in office, reviewed by me without previous to compare: NSR with ventricular rate 69 bpm.  No QTC prolongation, ST elevation or depression - unremarkable EKG.  Reviewed findings with patient who verbalized understanding.  Covid testing deferred at this time.  Will have patient keep symptom log, follow-up with PCP in 1 to 2 weeks for repeat  evaluation.  We will continue all home medications, keep symptom log in the interim.  Tachycardia last night could have been result of inhaler use/anxiety component to chest discomfort.  Return precautions discussed, patient verbalized understanding and is agreeable to plan. Final Clinical Impressions(s) /  UC Diagnoses   Final diagnoses:  None     Discharge Instructions     Keep symptom log & follow up with PCP in 1-2 weeks.     ED Prescriptions    None     PDMP not reviewed this encounter.   Hall[place]Potvin, [place], [place] 07/19/19 [phone]

## 2019-07-19 NOTE — ED Notes (Signed)
Patient able to ambulate independently  

## 2019-09-14 ENCOUNTER — Encounter: Payer: BC Managed Care – PPO | Admitting: Family Medicine

## 2019-10-12 DIAGNOSIS — F419 Anxiety disorder, unspecified: Secondary | ICD-10-CM | POA: Insufficient documentation

## 2019-10-26 ENCOUNTER — Encounter: Payer: Self-pay | Admitting: Allergy

## 2019-10-26 ENCOUNTER — Other Ambulatory Visit: Payer: Self-pay

## 2019-10-26 ENCOUNTER — Ambulatory Visit: Payer: BC Managed Care – PPO | Admitting: Allergy

## 2019-10-26 VITALS — BP 106/72 | HR 82 | Temp 97.8°F | Resp 16 | Ht 66.0 in | Wt 215.8 lb

## 2019-10-26 DIAGNOSIS — H1013 Acute atopic conjunctivitis, bilateral: Secondary | ICD-10-CM

## 2019-10-26 DIAGNOSIS — J3089 Other allergic rhinitis: Secondary | ICD-10-CM | POA: Diagnosis not present

## 2019-10-26 DIAGNOSIS — J4530 Mild persistent asthma, uncomplicated: Secondary | ICD-10-CM | POA: Diagnosis not present

## 2019-10-26 MED ORDER — BUDESONIDE-FORMOTEROL FUMARATE 160-4.5 MCG/ACT IN AERO: INHALATION_SPRAY | RESPIRATORY_TRACT | 5 refills | Status: DC

## 2019-10-26 MED ORDER — XHANCE 93 MCG/ACT NA EXHU: INHALANT_SUSPENSION | NASAL | 5 refills | Status: DC

## 2019-10-26 MED ORDER — CARBINOXAMINE MALEATE 6 MG PO TABS: ORAL_TABLET | ORAL | 5 refills | Status: DC

## 2019-10-26 NOTE — Progress Notes (Signed)
New Patient Note  RE: Allison Daniel MRN: [id number] DOB: 17-Jul-1990 Date of Office Visit: 10/26/2019  Referring provider: No ref. provider found Primary care provider: Chaney Malling, [place]  Chief Complaint: Asthma and allergies  History of present illness: Allison Daniel is a 29 y.o. female presenting today for evaluation of asthma and environmental allergies.  She has history of asthma.  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  She denies hospitalization for asthma flare as an adult but did not require the ED/hospitalization visits as a child.  She has had ED visits requiring nebulizations as an adult. Triggers include winter/colder months, change in seasons and humidity as well as allergies.  Symptoms include shortness of breath, chest pain, cough, wheeze.   On average will need to use albuterol several times a week.  Does report some coughing in the night but has not required albuterol.  She does take Symbicort 84mcg 2 puffs twice a day.  Does not have a spacer.  Has been on symbicort for past 2-3 years.  Was on singulair in the past and states it was not helping thus was taken off.   With her allergy symptoms she reports sneezing, itchy and watery eyes, itchy throat, nasal congestion.  She has used cetirizine which worked a while and then became ineffective. Has tried claritin, sudafed, Allegra-D and xyzal which all helped minimally.  She was on allergy shots that she started around 4th grade and she was on them until 6th grade and stopped due to cost.  She restarted allergy shots again for a short period of time after that but also couldn't continue due to cost.  Allergy shots did help her allergies however.  She is interested in doing allergy shots again at this time.  She had sinus surgery (turbinate reduction) and deviated septum repair in 03/2018.  She states the surgery did help decrease her degree of  congestion.    No history eczema or food allergy.    Review of systems: Review of Systems  Constitutional: Negative.   HENT:       See HPI  Eyes:       See HPI  Respiratory:       See HPI  Cardiovascular: Negative.   Gastrointestinal: Negative.   Musculoskeletal: Negative.   Skin: Negative.   Neurological: Negative.     All other systems negative unless noted above in HPI  Past medical history: Past Medical History:  Diagnosis Date  . Asthma   . Depression   . Frequent headaches   . GERD (gastroesophageal reflux disease)   . Migraine   . UTI (urinary tract infection)     Past surgical history: Past Surgical History:  Procedure Laterality Date  . SINOSCOPY    . SINUS IRRIGATION  03/2018   drained mucous pockets and fixed septum     Family history:  Family History  Problem Relation Age of Onset  . Hyperlipidemia Mother   . Hypertension Mother   . Allergic rhinitis Mother   . Alcohol abuse Maternal Grandmother   . Drug abuse Maternal Grandmother   . Allergic rhinitis Maternal Grandmother   . Arthritis Paternal Grandmother   . Diabetes Paternal Grandmother   . Angioedema Neg Hx   . Asthma Neg Hx   . Atopy Neg Hx   . Eczema Neg Hx   . Immunodeficiency Neg Hx   . Urticaria  Neg Hx     Social history: Lives in an apartment with carpeting with electric heating and central cooling.  No pets in the home.  No concern for water damage, mildew or roaches in the home.  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 Denies smoking history.   Medication List: Current Outpatient Medications  Medication Sig Dispense Refill  . albuterol (PROVENTIL HFA;VENTOLIN HFA) 108 (90 Base) MCG/ACT inhaler Inhale 2 puffs into the lungs every 6 (six) hours as needed for wheezing or shortness of breath. 1 Inhaler 11  . cetirizine (ZYRTEC) 10 MG tablet Take 10 mg by mouth daily.    . sertraline (ZOLOFT) 50 MG tablet Take 50 mg by mouth daily.    .  budesonide-formoterol (SYMBICORT) 160-4.5 MCG/ACT inhaler 2 puffs two times daily with spacer 1 Inhaler 5  . Carbinoxamine Maleate (RYVENT) 6 MG TABS 1 tablet two times daily 60 tablet 5  . Fluticasone Propionate (XHANCE) 93 MCG/ACT EXHU 2 sprays each nostril twice a day 32 mL 5   No current facility-administered medications for this visit.    Known medication allergies: Allergies  Allergen Reactions  . Cephalexin Rash     Physical examination: Blood pressure 106/72, pulse 82, temperature 97.8 F (36.6 C), temperature source Temporal, resp. rate 16, height 5\' 6"  (1.676 m), weight 215 lb 12.8 oz (97.9 kg), SpO2 97 %.  General: Alert, interactive, in no acute distress. HEENT: PERRLA, TMs pearly gray, turbinates markedly edematous and pale with clear discharge, post-pharynx non erythematous. Neck: Supple without lymphadenopathy. Lungs: Clear to auscultation without wheezing, rhonchi or rales. {no increased work of breathing. CV: Normal S1, S2 without murmurs. Abdomen: Nondistended, nontender. Skin: Warm and dry, without lesions or rashes. Extremities:  No clubbing, cyanosis or edema. Neuro:   Grossly intact.  Diagnositics/Labs:  Spirometry: FEV1: 2.41L 81%, FVC: 2.89L 83%, ratio consistent with nonobstructive pattern  Allergy testing: environmental allergy skin prick testing is positive to grass pollens, weeds pollens, tree pollens, molds, cat hair, cockroach.   Intradermal testing is positive to dog and mite mix.   Allergy testing results were read and interpreted by provider, documented by clinical staff.   Assessment and plan: Allergic rhinitis with conjunctivitis   -Environmental allergy skin testing today is positive to tree pollens, grass pollens, weed pollens, molds, cat, cockroach, dog and dust mite  -Allergen avoidance measures discussed/handouts provided  -Recommend trial of RyVent 6 mg take 1 tablet twice a day.  This is a prescription based antihistamine  -Continue  nasal saline rinses.  Use distilled water or boiled water and bring down to room temperature prior to use.  Breathe through your mouth through the entire process.  Then follow-up use with your medicated nasal spray.   -Recommend use of nasal Afrin (oxymetazoline) 2 sprays each nostril and wait 5 to 15 minutes or until you can breathe more freely through your nose.  Then follow with your medicated nasal spray as below.  Do not use Afrin more than 3 to 5 days at a time.  -Recommend trial of nasal Xhance which is a special nasal spray device that allows for deeper deposition of the medication to your sinuses.  XHANCE is fluticasone which is the same steroid as in Flonase.  It is in a stronger dose than Flonase.  XHANCE use 2 sprays each nostril twice a day.  -Recommend use of Pataday or Pataday extra strength 1 drop each eye daily as needed for watery, itchy or red eyes.  This  is an over-the-counter eyedrop.  - allergen immunotherapy discussed today including protocol, benefits and risk.  Informational handout provided.  If interested in this therapuetic option you can check with your insurance carrier for coverage.  Let us know if you would like to proceed with this option.    Mild persistent asthma  - have access to albuterol inhaler 2 puffs every 4-6 hours as needed for cough/wheeze/shortness of breath/chest tightness.  May use 15-20 minutes prior to activity.   Monitor frequency of use.    -Increase to Symbicort 160 mcg 2 puffs twice a day.  Use with spacer.  Spacer device provided today.  Asthma control goals:   Full participation in all desired activities (may need albuterol before activity)  Albuterol use two time or less a week on average (not counting use with activity)  Cough interfering with sleep two time or less a month  Oral steroids no more than once a year  No hospitalizations  Follow-up in 3-4 months or sooner if needed  I appreciate the opportunity to take part in Sayler's  care. Please do not hesitate to contact me with questions.  Sincerely,   Margo Aye, MD Allergy/Immunology [facility]

## 2019-10-26 NOTE — Patient Instructions (Addendum)
-  Environmental allergy skin testing today is positive to tree pollens, grass pollens, weed pollens, molds, cat, cockroach, dog and dust mite  -Allergen avoidance measures discussed/handouts provided  -Recommend trial of RyVent 6 mg take 1 tablet twice a day.  This is a prescription based antihistamine  -Continue nasal saline rinses.  Use distilled water or boiled water and bring down to room temperature prior to use.  Breathe through your mouth through the entire process.  Then follow-up use with your medicated nasal spray.   -Recommend use of nasal Afrin (oxymetazoline) 2 sprays each nostril and wait 5 to 15 minutes or until you can breathe more freely through your nose.  Then follow with your medicated nasal spray as below.  Do not use Afrin more than 3 to 5 days at a time.  -Recommend trial of nasal Xhance which is a special nasal spray device that allows for deeper deposition of the medication to your sinuses.  XHANCE is fluticasone which is the same steroid as in Flonase.  It is in a stronger dose than Flonase.  XHANCE use 2 sprays each nostril twice a day.  -Recommend use of Pataday or Pataday extra strength 1 drop each eye daily as needed for watery, itchy or red eyes.  This is an over-the-counter eyedrop.  - allergen immunotherapy discussed today including protocol, benefits and risk.  Informational handout provided.  If interested in this therapuetic option you can check with your insurance carrier for coverage.  Let us know if you would like to proceed with this option.     - have access to albuterol inhaler 2 puffs every 4-6 hours as needed for cough/wheeze/shortness of breath/chest tightness.  May use 15-20 minutes prior to activity.   Monitor frequency of use.    -Increase to Symbicort 160 mcg 2 puffs twice a day.  Use with spacer.  Spacer device provided today.  Asthma control goals:   Full participation in all desired activities (may need albuterol before activity)  Albuterol use two  time or less a week on average (not counting use with activity)  Cough interfering with sleep two time or less a month  Oral steroids no more than once a year  No hospitalizations  Follow-up in 3-4 months or sooner if needed

## 2019-11-23 DIAGNOSIS — F418 Other specified anxiety disorders: Secondary | ICD-10-CM | POA: Insufficient documentation

## 2019-12-28 DIAGNOSIS — R7689 Other specified abnormal immunological findings in serum: Secondary | ICD-10-CM | POA: Insufficient documentation

## 2020-02-02 ENCOUNTER — Ambulatory Visit: Payer: BC Managed Care – PPO | Admitting: Allergy

## 2020-04-09 ENCOUNTER — Other Ambulatory Visit: Payer: Self-pay

## 2020-04-11 ENCOUNTER — Other Ambulatory Visit: Payer: Self-pay | Admitting: *Deleted

## 2020-04-11 MED ORDER — XHANCE 93 MCG/ACT NA EXHU: INHALANT_SUSPENSION | NASAL | 5 refills | Status: DC

## 2020-04-16 ENCOUNTER — Other Ambulatory Visit: Payer: Self-pay

## 2020-05-24 ENCOUNTER — Ambulatory Visit: Payer: BC Managed Care – PPO | Admitting: Allergy

## 2020-06-26 ENCOUNTER — Ambulatory Visit: Payer: BC Managed Care – PPO

## 2020-06-28 ENCOUNTER — Ambulatory Visit: Payer: Self-pay | Attending: Internal Medicine

## 2020-06-28 DIAGNOSIS — Z23 Encounter for immunization: Secondary | ICD-10-CM

## 2020-06-28 NOTE — Progress Notes (Signed)
   Covid-19 Vaccination Clinic  Name:  Allison Daniel    MRN: [id number] DOB: [birth year]  06/28/2020  Ms. Allison Daniel was observed post Covid-19 immunization for 15 minutes without incident. She was provided with Vaccine Information Sheet and instruction to access the V-Safe system.   Ms. Allison Daniel was instructed to call 911 with any severe reactions post vaccine: Marland Kitchen Difficulty breathing  . Swelling of face and throat  . A fast heartbeat  . A bad rash all over body  . Dizziness and weakness   Immunizations Administered    Name Date Dose VIS Date Route   Pfizer COVID-19 Vaccine 06/28/2020  4:42 PM 0.3 mL 03/28/2020 Intramuscular   Manufacturer: [employer], [employer]   Lot: [id number]   NDC: [address]-6

## 2020-07-09 ENCOUNTER — Other Ambulatory Visit: Payer: Self-pay

## 2020-07-12 ENCOUNTER — Encounter: Payer: Self-pay | Admitting: Allergy

## 2020-07-12 ENCOUNTER — Other Ambulatory Visit: Payer: Self-pay

## 2020-07-12 ENCOUNTER — Ambulatory Visit: Payer: BC Managed Care – PPO | Admitting: Allergy

## 2020-07-12 VITALS — BP 120/78 | HR 73 | Temp 97.3°F | Resp 18 | Ht 66.0 in | Wt 221.6 lb

## 2020-07-12 DIAGNOSIS — J3089 Other allergic rhinitis: Secondary | ICD-10-CM | POA: Diagnosis not present

## 2020-07-12 DIAGNOSIS — H1013 Acute atopic conjunctivitis, bilateral: Secondary | ICD-10-CM | POA: Diagnosis not present

## 2020-07-12 DIAGNOSIS — J4540 Moderate persistent asthma, uncomplicated: Secondary | ICD-10-CM

## 2020-07-12 MED ORDER — BUDESONIDE-FORMOTEROL FUMARATE 160-4.5 MCG/ACT IN AERO: INHALATION_SPRAY | RESPIRATORY_TRACT | 5 refills | Status: DC

## 2020-07-12 MED ORDER — ALBUTEROL SULFATE HFA 108 (90 BASE) MCG/ACT IN AERS: 2.0000 | INHALATION_SPRAY | Freq: Four times a day (QID) | RESPIRATORY_TRACT | 11 refills | Status: AC | PRN

## 2020-07-12 MED ORDER — FEXOFENADINE HCL 180 MG PO TABS: 180.0000 mg | ORAL_TABLET | Freq: Every day | ORAL | 2 refills | Status: DC

## 2020-07-12 NOTE — Patient Instructions (Signed)
-  continue avoidance measures for tree pollens, grass pollens, weed pollens, molds, cat, cockroach, dog and dust mite  -try Allegra 180mg  daily.  If does not make you drowsy then can take twice a day if needed  -Continue nasal saline rinses.  Use distilled water or boiled water and bring down to room temperature prior to use.  Breathe through your mouth through the entire process.  Then follow-up use with your medicated nasal spray.  -continue Xhance 2 sprays each nostril twice a day as needed for nasal congestion  -Recommend use of Pataday or Pataday extra strength 1 drop each eye daily as needed for watery, itchy or red eyes.  This is an over-the-counter eyedrop.  - allergen immunotherapy discussed today including protocol, benefits and risk.  Informational handout provided.  If interested in this therapuetic option you can check with your insurance carrier for coverage.  Let know if you would like to proceed with this option.     - have access to albuterol inhaler 2 puffs every 4-6 hours as needed for cough/wheeze/shortness of breath/chest tightness.  May use 15-20 minutes prior to activity.   Monitor frequency of use.    -continue Symbicort 160 mcg 2 puffs once a day.  Use with spacer.  Increase to twice a day is not meeting the below goals or having a flare  Asthma control goals:   Full participation in all desired activities (may need albuterol before activity)  Albuterol use two time or less a week on average (not counting use with activity)  Cough interfering with sleep two time or less a month  Oral steroids no more than once a year  No hospitalizations  Follow-up in 4-6 months or sooner if needed

## 2020-07-12 NOTE — Progress Notes (Signed)
Follow-up Note  RE: [name] MRN: [id number] DOB: 12-25-1990 Date of Office Visit: 07/12/2020   History of present illness: Allison Daniel is a 30 y.o. female presenting today for follow-up of allergic rhinoconjunctivitis as well as asthma.  She was last seen in the office on 10/26/2019 by myself.  She has done well since her last visit without any major health changes, surgeries or hospitalizations.  She has questions today in regards to her antihistamine Allegra use.  She does take Zoloft at night which she states makes her sleepy and she is wondering if she can take Allegra and Zoloft at the same time.  She does state that the antihistamines typically make her drowsy as well.  She has not tried antihistamine to date that has not made her drowsy however she has not tried [employer].  She states the [facility] nasal spray has worked well for her nasal congestion control.  She has plenty that is sent from the company to continue use.  She states she has had some issues with watery itchy eyes but does not have the Pataday to use that is over-the-counter at this time.  With her asthma she does continue to take Symbicort 2 puffs once a day.  She has not required use of her albuterol and definitely has not needed to use it more than twice a week on average.  She has not required any urgent care or ED visits or any systemic steroid needs.  Review of systems: Review of Systems  Constitutional: Negative.   HENT: Negative.   Eyes: Negative.   Respiratory: Negative.   Cardiovascular: Negative.   Gastrointestinal: Negative.   Musculoskeletal: Negative.   Skin: Negative.   Neurological: Negative.     All other systems negative unless noted above in HPI  Past medical/social/surgical/family history have been reviewed and are unchanged unless specifically indicated below.  No changes  Medication List: Current Outpatient Medications  Medication Sig Dispense Refill  . fexofenadine  (ALLEGRA) 180 MG tablet Take 1 tablet (180 mg total) by mouth daily. 30 tablet 2  . Fluticasone Propionate (XHANCE) 93 MCG/ACT EXHU 2 sprays each nostril twice a day 32 mL 5  . sertraline (ZOLOFT) 50 MG tablet Take 50 mg by mouth daily.    Marland Kitchen albuterol (VENTOLIN HFA) 108 (90 Base) MCG/ACT inhaler Inhale 2 puffs into the lungs every 6 (six) hours as needed for wheezing or shortness of breath. 1 each 11  . budesonide-formoterol (SYMBICORT) 160-4.5 MCG/ACT inhaler 2 puffs two times daily with spacer 1 each 5   No current facility-administered medications for this visit.     Known medication allergies: Allergies  Allergen Reactions  . Cephalexin Rash     Physical examination: Blood pressure 120/78, pulse 73, temperature (!) 97.3 F (36.3 C), resp. rate 18, height 5\' 6"  (1.676 m), weight 221 lb 9.8 oz (100.5 kg), SpO2 98 %.  General: Alert, interactive, in no acute distress. HEENT: PERRLA, TMs pearly gray, turbinates mildly edematous without discharge, post-pharynx non erythematous. Neck: Supple without lymphadenopathy. Lungs: Clear to auscultation without wheezing, rhonchi or rales. {no increased work of breathing. CV: Normal S1, S2 without murmurs. Abdomen: Nondistended, nontender. Skin: Warm and dry, without lesions or rashes. Extremities:  No clubbing, cyanosis or edema. Neuro:   Grossly intact.  Diagnositics/Labs: None today  Assessment and plan: Allergic rhinitis with conjunctivitis   -continue avoidance measures for tree pollens, grass pollens, weed pollens, molds, cat, cockroach, dog and dust mite  -try Allegra 180mg   daily.  If does not make you drowsy then can take twice a day if needed  -Continue nasal saline rinses.  Use distilled water or boiled water and bring down to room temperature prior to use.  Breathe through your mouth through the entire process.  Then follow-up use with your medicated nasal spray.  -continue Xhance 2 sprays each nostril twice a day as needed for  nasal congestion  -Recommend use of Pataday or Pataday extra strength 1 drop each eye daily as needed for watery, itchy or red eyes.  This is an over-the-counter eyedrop.  - allergen immunotherapy discussed today including protocol, benefits and risk.  Informational handout provided.  If interested in this therapuetic option you can check with your insurance carrier for coverage.  Let us know if you would like to proceed with this option.    Mild persistent asthma  - have access to albuterol inhaler 2 puffs every 4-6 hours as needed for cough/wheeze/shortness of breath/chest tightness.  May use 15-20 minutes prior to activity.   Monitor frequency of use.   -continue Symbicort 160 mcg 2 puffs once a day.  Use with spacer.  Increase to twice a day is not meeting the below goals or having a flare  Asthma control goals:   Full participation in all desired activities (may need albuterol before activity)  Albuterol use two time or less a week on average (not counting use with activity)  Cough interfering with sleep two time or less a month  Oral steroids no more than once a year  No hospitalizations  Follow-up in 4-6 months or sooner if needed  I appreciate the opportunity to take part in Rhyan's care. Please do not hesitate to contact me with questions.  Sincerely,   Margo Aye, MD Allergy/Immunology [facility]

## 2020-07-25 DIAGNOSIS — D649 Anemia, unspecified: Secondary | ICD-10-CM | POA: Insufficient documentation

## 2020-08-27 ENCOUNTER — Other Ambulatory Visit: Payer: Self-pay | Admitting: *Deleted

## 2020-08-27 MED ORDER — FEXOFENADINE HCL 180 MG PO TABS: 180.0000 mg | ORAL_TABLET | Freq: Every day | ORAL | 2 refills | Status: DC

## 2020-09-24 NOTE — Addendum Note (Signed)
Addended by: Lorrin Mais on: 09/24/2020 02:33 PM   Modules accepted: Orders

## 2020-09-25 DIAGNOSIS — J3081 Allergic rhinitis due to animal (cat) (dog) hair and dander: Secondary | ICD-10-CM | POA: Diagnosis not present

## 2020-09-25 NOTE — Progress Notes (Signed)
Aeroallergen Immunotherapy    Patient Details  Name: Allison Daniel  MRN: [id number]  Date of Birth: 10-18-1990   Order 2 of 2   Vial Label: Mold, mite, cat   0.2 ml (Volume) 1:20 Concentration -- Alternaria alternata  0.2 ml (Volume) 1:10 Concentration -- Aspergillus mix  0.2 ml (Volume) 1:10 Concentration -- Penicillium mix  0.2 ml (Volume) 1:20 Concentration -- Bipolaris sorokiniana  0.2 ml (Volume) 1:20 Concentration -- Drechslera spicifera  0.2 ml (Volume) 1:10 Concentration -- Fusarium moniliforme  0.2 ml (Volume) 1:40 Concentration -- Aureobasidium pullulans  0.2 ml (Volume) 1:40 Concentration -- Epicoccum nigrum  0.5 ml (Volume) 1:10 Concentration -- Cat Hair  0.3 ml (Volume) 1:20 Concentration -- Cockroach, German  0.5 ml (Volume)  AU Concentration -- Mite Mix (DF 5,000 & DP 5,000)    2.9 ml Extract Subtotal  2.1 ml Diluent  5.0 ml Maintenance Total    Final Concentration above is stated in weight/volume (wt/vol). Allergen units (AU/ml) biological units (BAU/ml). The total volume is 5 ml.    Schedule: B   Special Instructions: 1 injection/week

## 2020-09-25 NOTE — Progress Notes (Signed)
VIALS EXP 09-25-21 

## 2020-09-25 NOTE — Progress Notes (Signed)
Aeroallergen Immunotherapy    Patient Details  Name: Natacia Chaisson  MRN: [id number]  Date of Birth: October 01, 1990   Order 1 of 2   Vial Label: Pollen, dog   0.3 ml (Volume) BAU Concentration -- 7 Grass Mix* 100,000 ([address] [place], [place], [place], Perennial [place], RedTop, Sweet Vernal, Timothy)  0.2 ml (Volume) 1:20 Concentration -- [place]  0.3 ml (Volume) BAU Concentration -- [place] 10,000  0.2 ml (Volume) 1:20 Concentration -- Johnson  0.3 ml (Volume) 1:20 Concentration -- Ragweed Mix  0.2 ml (Volume) 1:20 Concentration -- Cocklebur  0.2 ml (Volume) 1:20 Concentration -- Burweed [place]er  0.2 ml (Volume) 1:10 Concentration -- Plantain English  0.5 ml (Volume) 1:20 Concentration -- Weed Mix*  0.5 ml (Volume) 1:20 Concentration -- Eastern 10 Tree Mix (also Sweet Gum)  0.2 ml (Volume) 1:20 Concentration -- Box Elder  0.2 ml (Volume) 1:10 Concentration -- Cedar, red  0.2 ml (Volume) 1:10 Concentration -- Pecan Pollen  0.2 ml (Volume) 1:10 Concentration -- Pine Mix  0.2 ml (Volume) 1:20 Concentration -- Walnut, Black Pollen  0.5 ml (Volume) 1:10 Concentration -- Dog Epithelia    4.4 ml Extract Subtotal  0.6 ml Diluent  5.0 ml Maintenance Total    Final Concentration above is stated in weight/volume (wt/vol). Allergen units (AU/ml) biological units (BAU/ml). The total volume is 5 ml.    Schedule: B   Special Instructions: 1 injection

## 2020-09-26 DIAGNOSIS — J3089 Other allergic rhinitis: Secondary | ICD-10-CM

## 2020-10-12 ENCOUNTER — Other Ambulatory Visit: Payer: Self-pay

## 2020-10-12 ENCOUNTER — Ambulatory Visit (INDEPENDENT_AMBULATORY_CARE_PROVIDER_SITE_OTHER): Payer: BC Managed Care – PPO

## 2020-10-12 DIAGNOSIS — J309 Allergic rhinitis, unspecified: Secondary | ICD-10-CM | POA: Diagnosis not present

## 2020-10-12 MED ORDER — EPINEPHRINE 0.3 MG/0.3ML IJ SOAJ: 0.3000 mg | Freq: Once | INTRAMUSCULAR | 1 refills | Status: AC

## 2020-10-12 NOTE — Progress Notes (Signed)
Immunotherapy   Patient Details  Name: Amanat Hackel MRN: [id number] Date of Birth: 02-14-1991  10/12/2020  Roderic Scarce pt here to restart injections on blue vial Following schedule: b  Frequency:weekly Epi-Pen:yes ad trained in office Consent signed and patient instructions given.   Laury Axon Bobie Kistler 10/12/2020, 9:30 AM

## 2020-10-19 ENCOUNTER — Ambulatory Visit (INDEPENDENT_AMBULATORY_CARE_PROVIDER_SITE_OTHER): Payer: BC Managed Care – PPO

## 2020-10-19 DIAGNOSIS — J309 Allergic rhinitis, unspecified: Secondary | ICD-10-CM | POA: Diagnosis not present

## 2020-10-23 ENCOUNTER — Ambulatory Visit (INDEPENDENT_AMBULATORY_CARE_PROVIDER_SITE_OTHER): Payer: BC Managed Care – PPO | Admitting: *Deleted

## 2020-10-23 DIAGNOSIS — J309 Allergic rhinitis, unspecified: Secondary | ICD-10-CM

## 2020-11-02 ENCOUNTER — Ambulatory Visit (INDEPENDENT_AMBULATORY_CARE_PROVIDER_SITE_OTHER): Payer: BC Managed Care – PPO | Admitting: *Deleted

## 2020-11-02 DIAGNOSIS — J309 Allergic rhinitis, unspecified: Secondary | ICD-10-CM

## 2020-11-08 ENCOUNTER — Ambulatory Visit (INDEPENDENT_AMBULATORY_CARE_PROVIDER_SITE_OTHER): Payer: BC Managed Care – PPO | Admitting: *Deleted

## 2020-11-08 DIAGNOSIS — J309 Allergic rhinitis, unspecified: Secondary | ICD-10-CM | POA: Diagnosis not present

## 2020-11-16 ENCOUNTER — Ambulatory Visit (INDEPENDENT_AMBULATORY_CARE_PROVIDER_SITE_OTHER): Payer: BC Managed Care – PPO | Admitting: *Deleted

## 2020-11-16 DIAGNOSIS — J309 Allergic rhinitis, unspecified: Secondary | ICD-10-CM

## 2020-11-30 ENCOUNTER — Ambulatory Visit (INDEPENDENT_AMBULATORY_CARE_PROVIDER_SITE_OTHER): Payer: BC Managed Care – PPO

## 2020-11-30 DIAGNOSIS — J309 Allergic rhinitis, unspecified: Secondary | ICD-10-CM

## 2020-12-05 ENCOUNTER — Ambulatory Visit (INDEPENDENT_AMBULATORY_CARE_PROVIDER_SITE_OTHER): Payer: BC Managed Care – PPO

## 2020-12-05 DIAGNOSIS — J309 Allergic rhinitis, unspecified: Secondary | ICD-10-CM | POA: Diagnosis not present

## 2020-12-14 ENCOUNTER — Ambulatory Visit (INDEPENDENT_AMBULATORY_CARE_PROVIDER_SITE_OTHER): Payer: BC Managed Care – PPO

## 2020-12-14 DIAGNOSIS — J309 Allergic rhinitis, unspecified: Secondary | ICD-10-CM

## 2020-12-20 ENCOUNTER — Other Ambulatory Visit: Payer: Self-pay | Admitting: Orthopedic Surgery

## 2020-12-20 ENCOUNTER — Ambulatory Visit (INDEPENDENT_AMBULATORY_CARE_PROVIDER_SITE_OTHER): Payer: BC Managed Care – PPO | Admitting: *Deleted

## 2020-12-20 DIAGNOSIS — J309 Allergic rhinitis, unspecified: Secondary | ICD-10-CM

## 2020-12-20 DIAGNOSIS — M5459 Other low back pain: Secondary | ICD-10-CM

## 2020-12-22 ENCOUNTER — Other Ambulatory Visit: Payer: Self-pay

## 2020-12-22 ENCOUNTER — Ambulatory Visit
Admission: RE | Admit: 2020-12-22 | Discharge: 2020-12-22 | Disposition: A | Discharge status: Home / Self Care | Payer: BC Managed Care – PPO | Source: Ambulatory Visit | Attending: Orthopedic Surgery | Admitting: Orthopedic Surgery

## 2020-12-22 DIAGNOSIS — M5459 Other low back pain: Secondary | ICD-10-CM

## 2020-12-27 ENCOUNTER — Telehealth: Payer: Self-pay | Admitting: *Deleted

## 2020-12-27 ENCOUNTER — Ambulatory Visit (INDEPENDENT_AMBULATORY_CARE_PROVIDER_SITE_OTHER): Payer: BC Managed Care – PPO | Admitting: *Deleted

## 2020-12-27 DIAGNOSIS — J309 Allergic rhinitis, unspecified: Secondary | ICD-10-CM

## 2020-12-27 NOTE — Telephone Encounter (Signed)
Error

## 2021-01-10 ENCOUNTER — Ambulatory Visit: Payer: BC Managed Care – PPO | Admitting: Allergy

## 2021-01-11 ENCOUNTER — Ambulatory Visit: Payer: BC Managed Care – PPO | Admitting: Allergy

## 2021-01-11 ENCOUNTER — Other Ambulatory Visit: Payer: Self-pay

## 2021-01-11 ENCOUNTER — Encounter: Payer: Self-pay | Admitting: Allergy

## 2021-01-11 VITALS — BP 118/72 | HR 71 | Temp 98.2°F | Resp 16

## 2021-01-11 DIAGNOSIS — J3089 Other allergic rhinitis: Secondary | ICD-10-CM

## 2021-01-11 DIAGNOSIS — J309 Allergic rhinitis, unspecified: Secondary | ICD-10-CM

## 2021-01-11 DIAGNOSIS — J4540 Moderate persistent asthma, uncomplicated: Secondary | ICD-10-CM | POA: Diagnosis not present

## 2021-01-11 DIAGNOSIS — H1013 Acute atopic conjunctivitis, bilateral: Secondary | ICD-10-CM | POA: Diagnosis not present

## 2021-01-11 NOTE — Progress Notes (Signed)
Follow-up Note  RE: Allison Daniel MRN: [id number] DOB: 1990-08-07 Date of Office Visit: 01/11/2021   History of present illness: Allison Daniel is a 30 y.o. female presenting today for follow-up of allergic rhinitis with conjunctivitis and asthma.  She was last seen in the office on 07/12/2020 by myself.  She has not had any major health changes, surgeries or hospitalizations. She states that her allergies are not well controlled.  She still reports a lot of sneezing.   The Allegra is not working thus she did go back to Zyrtec last week.  She tried Xyzal before and states it does have a long enough effect.  She does state that the [place] helps with congestion control.  She has not needed to use Pataday for itchy watery eyes.  She did start on allergen immunotherapy since her last visit however she still remains in the blue vial.   With her asthma she has not needed to use albuterol.  She does continue to take Symbicort 160 mcg 2 puffs once a day.  She has not needed to increase to twice a day dosing.  She has not required ED or urgent care visits or any systemic steroid needs.  Review of systems: Review of Systems  Constitutional: Negative.   HENT:         See HPI  Eyes: Negative.   Respiratory: Negative.    Cardiovascular: Negative.   Gastrointestinal: Negative.   Musculoskeletal: Negative.   Skin: Negative.   Neurological: Negative.    All other systems negative unless noted above in HPI  Past medical/social/surgical/family history have been reviewed and are unchanged unless specifically indicated below.  No changes  Medication List: Current Outpatient Medications  Medication Sig Dispense Refill   albuterol (VENTOLIN HFA) 108 (90 Base) MCG/ACT inhaler Inhale 2 puffs into the lungs every 6 (six) hours as needed for wheezing or shortness of breath. 1 each 11   budesonide-formoterol (SYMBICORT) 160-4.5 MCG/ACT inhaler 2 puffs two times daily with spacer 1 each 5   Fluticasone  Propionate (XHANCE) 93 MCG/ACT EXHU 2 sprays each nostril twice a day 32 mL 5   sertraline (ZOLOFT) 50 MG tablet Take 50 mg by mouth daily.     No current facility-administered medications for this visit.     Known medication allergies: Allergies  Allergen Reactions   Cephalexin Rash     Physical examination: Blood pressure 118/72, pulse 71, temperature 98.2 F (36.8 C), temperature source Temporal, resp. rate 16, SpO2 97 %.  General: Alert, interactive, in no acute distress. HEENT: PERRLA, TMs pearly gray, turbinates minimally edematous without discharge, post-pharynx non erythematous. Neck: Supple without lymphadenopathy. Lungs: Clear to auscultation without wheezing, rhonchi or rales. {no increased work of breathing. CV: Normal S1, S2 without murmurs. Abdomen: Nondistended, nontender. Skin: Warm and dry, without lesions or rashes. Extremities:  No clubbing, cyanosis or edema. Neuro:   Grossly intact.  Diagnositics/Labs: Spirometry: FEV1: 2.21 L 75%, FVC: 2.6 L 74% predicted.  This ratio is more consistent with restrictive pattern  Assessment and plan: Allergic rhinitis with conjunctivitis -continue avoidance measures for tree pollens, grass pollens, weed pollens, molds, cat, cockroach, dog and dust mite  -let us know if Zyrtec is working for you.  If not next antihistamine to try would be Ryvent 6mg  twice a day which is a prescription antihistamine.   Allegra and Xyzal have not been effective  -Continue nasal saline rinses.  Use distilled water or boiled water and bring down to room temperature prior  to use.  Breathe through your mouth through the entire process.  Then follow-up use with your medicated nasal spray.  -continue Xhance 2 sprays each nostril twice a day as needed for nasal congestion  -Recommend use of Pataday or Pataday extra strength 1 drop each eye daily as needed for watery, itchy or red eyes.  This is an over-the-counter eyedrop.  - continue allergy shots per  protocol.  You are in the blue vial which is 3 vials away from red maintenance vial.  Once you are in red vial for 6-12 months then can reassess allergy symptom control  Mild persistent asthma  - have access to albuterol inhaler 2 puffs every 4-6 hours as needed for cough/wheeze/shortness of breath/chest tightness.  May use 15-20 minutes prior to activity.   Monitor frequency of use.    -continue Symbicort 160 mcg 2 puffs once a day.  Use with spacer.  Increase to twice a day is not meeting the below goals or having a flare  Asthma control goals:  Full participation in all desired activities (may need albuterol before activity) Albuterol use two time or less a week on average (not counting use with activity) Cough interfering with sleep two time or less a month Oral steroids no more than once a year No hospitalizations  Follow-up in 6 months or sooner if needed  I appreciate the opportunity to take part in Silas's care. Please do not hesitate to contact me with questions.  Sincerely,   Margo Aye, MD Allergy/Immunology [facility]

## 2021-01-11 NOTE — Patient Instructions (Addendum)
-  continue avoidance measures for tree pollens, grass pollens, weed pollens, molds, cat, cockroach, dog and dust mite  -let us know if Zyrtec is working for you.  If not next antihistamine to try would be Ryvent 6mg  twice a day which is a prescription antihistamine.   Allegra and Xyzal have not been effective  -Continue nasal saline rinses.  Use distilled water or boiled water and bring down to room temperature prior to use.  Breathe through your mouth through the entire process.  Then follow-up use with your medicated nasal spray.  -continue Xhance 2 sprays each nostril twice a day as needed for nasal congestion  -Recommend use of Pataday or Pataday extra strength 1 drop each eye daily as needed for watery, itchy or red eyes.  This is an over-the-counter eyedrop.  - continue allergy shots per protocol.  You are in the blue vial which is 3 vials away from red maintenance vial.  Once you are in red vial for 6-12 months then can reassess allergy symptom control   - have access to albuterol inhaler 2 puffs every 4-6 hours as needed for cough/wheeze/shortness of breath/chest tightness.  May use 15-20 minutes prior to activity.   Monitor frequency of use.    -continue Symbicort 160 mcg 2 puffs once a day.  Use with spacer.  Increase to twice a day is not meeting the below goals or having a flare  Asthma control goals:  Full participation in all desired activities (may need albuterol before activity) Albuterol use two time or less a week on average (not counting use with activity) Cough interfering with sleep two time or less a month Oral steroids no more than once a year No hospitalizations  Follow-up in 6 months or sooner if needed

## 2021-01-24 ENCOUNTER — Ambulatory Visit (INDEPENDENT_AMBULATORY_CARE_PROVIDER_SITE_OTHER): Payer: BC Managed Care – PPO

## 2021-01-24 DIAGNOSIS — J309 Allergic rhinitis, unspecified: Secondary | ICD-10-CM | POA: Diagnosis not present

## 2021-02-01 ENCOUNTER — Ambulatory Visit (INDEPENDENT_AMBULATORY_CARE_PROVIDER_SITE_OTHER): Payer: BC Managed Care – PPO | Admitting: *Deleted

## 2021-02-01 DIAGNOSIS — J309 Allergic rhinitis, unspecified: Secondary | ICD-10-CM

## 2021-02-08 ENCOUNTER — Ambulatory Visit (INDEPENDENT_AMBULATORY_CARE_PROVIDER_SITE_OTHER): Payer: BC Managed Care – PPO

## 2021-02-08 DIAGNOSIS — J309 Allergic rhinitis, unspecified: Secondary | ICD-10-CM

## 2021-02-21 ENCOUNTER — Ambulatory Visit (INDEPENDENT_AMBULATORY_CARE_PROVIDER_SITE_OTHER): Payer: BC Managed Care – PPO | Admitting: *Deleted

## 2021-02-21 DIAGNOSIS — J309 Allergic rhinitis, unspecified: Secondary | ICD-10-CM

## 2021-02-28 ENCOUNTER — Ambulatory Visit (INDEPENDENT_AMBULATORY_CARE_PROVIDER_SITE_OTHER): Payer: BC Managed Care – PPO | Admitting: *Deleted

## 2021-02-28 DIAGNOSIS — J309 Allergic rhinitis, unspecified: Secondary | ICD-10-CM

## 2021-03-07 ENCOUNTER — Ambulatory Visit (INDEPENDENT_AMBULATORY_CARE_PROVIDER_SITE_OTHER): Payer: BC Managed Care – PPO

## 2021-03-07 DIAGNOSIS — J309 Allergic rhinitis, unspecified: Secondary | ICD-10-CM | POA: Diagnosis not present

## 2021-03-21 ENCOUNTER — Ambulatory Visit (INDEPENDENT_AMBULATORY_CARE_PROVIDER_SITE_OTHER): Payer: BC Managed Care – PPO | Admitting: *Deleted

## 2021-03-21 DIAGNOSIS — J309 Allergic rhinitis, unspecified: Secondary | ICD-10-CM | POA: Diagnosis not present

## 2021-04-02 ENCOUNTER — Ambulatory Visit (INDEPENDENT_AMBULATORY_CARE_PROVIDER_SITE_OTHER): Payer: BC Managed Care – PPO | Admitting: *Deleted

## 2021-04-02 DIAGNOSIS — J309 Allergic rhinitis, unspecified: Secondary | ICD-10-CM | POA: Diagnosis not present

## 2021-04-11 ENCOUNTER — Ambulatory Visit (INDEPENDENT_AMBULATORY_CARE_PROVIDER_SITE_OTHER): Payer: BC Managed Care – PPO | Admitting: *Deleted

## 2021-04-11 DIAGNOSIS — J309 Allergic rhinitis, unspecified: Secondary | ICD-10-CM | POA: Diagnosis not present

## 2021-04-18 ENCOUNTER — Ambulatory Visit (INDEPENDENT_AMBULATORY_CARE_PROVIDER_SITE_OTHER): Payer: BC Managed Care – PPO

## 2021-04-18 DIAGNOSIS — J309 Allergic rhinitis, unspecified: Secondary | ICD-10-CM

## 2021-04-30 ENCOUNTER — Ambulatory Visit (INDEPENDENT_AMBULATORY_CARE_PROVIDER_SITE_OTHER): Payer: BC Managed Care – PPO

## 2021-04-30 DIAGNOSIS — J309 Allergic rhinitis, unspecified: Secondary | ICD-10-CM | POA: Diagnosis not present

## 2021-05-09 ENCOUNTER — Ambulatory Visit (INDEPENDENT_AMBULATORY_CARE_PROVIDER_SITE_OTHER): Payer: BC Managed Care – PPO | Admitting: *Deleted

## 2021-05-09 DIAGNOSIS — J309 Allergic rhinitis, unspecified: Secondary | ICD-10-CM

## 2021-06-14 ENCOUNTER — Ambulatory Visit (INDEPENDENT_AMBULATORY_CARE_PROVIDER_SITE_OTHER): Payer: BC Managed Care – PPO

## 2021-06-14 DIAGNOSIS — J309 Allergic rhinitis, unspecified: Secondary | ICD-10-CM

## 2021-07-09 DIAGNOSIS — E559 Vitamin D deficiency, unspecified: Secondary | ICD-10-CM | POA: Insufficient documentation

## 2021-07-17 ENCOUNTER — Other Ambulatory Visit: Payer: Self-pay

## 2021-07-17 ENCOUNTER — Ambulatory Visit: Payer: BC Managed Care – PPO | Admitting: Allergy

## 2021-07-17 ENCOUNTER — Encounter: Payer: Self-pay | Admitting: Allergy

## 2021-07-17 VITALS — BP 120/82 | HR 80 | Temp 97.8°F | Resp 16 | Ht 66.0 in | Wt 244.5 lb

## 2021-07-17 DIAGNOSIS — H1013 Acute atopic conjunctivitis, bilateral: Secondary | ICD-10-CM

## 2021-07-17 DIAGNOSIS — J4530 Mild persistent asthma, uncomplicated: Secondary | ICD-10-CM

## 2021-07-17 DIAGNOSIS — J309 Allergic rhinitis, unspecified: Secondary | ICD-10-CM | POA: Diagnosis not present

## 2021-07-17 DIAGNOSIS — J3089 Other allergic rhinitis: Secondary | ICD-10-CM

## 2021-07-17 NOTE — Progress Notes (Signed)
Follow-up Note  RE: Allison Daniel MRN: [id number] DOB: 1991-04-15 Date of Office Visit: 07/17/2021   History of present illness: Allison Daniel is a 31 y.o. female presenting today for follow-up of allergic rhinitis with conjunctivitis and asthma.  She was last seen in the office on 01/11/2017 by myself. She did get Covid in December with symptoms of shortness of breath, chest tightness, congestion, myalgia, chills, poor appetite, fatigue.   She has to use her albuterol often during this time. She also states did take OTC cough/cold medications.   During fall she was noticing more sneezing, itchy eyes in regards to her allergies.  She does state her allergy shots have been helping with her symptom control.  She continues to do Zyrtec which she states is effective at this time.  She also uses Xhance as needed for nasal congestion.  She has not needed to use any antihistamine based eyedrops lately. She states her asthma usually flares in fall and winter months.  With the changes in the weather it has been driving her asthma symptoms with being cold and then warm again back to cold.  She is using Symbicort 160 mcg 2 puffs once a day at this time.  She has not needed to use her albuterol since she had COVID.  She has not required any systemic steroids or any ED or urgent care visits.  Review of systems: Review of Systems  Constitutional: Negative.   HENT: Negative.    Eyes: Negative.   Respiratory: Negative.    Cardiovascular: Negative.   Gastrointestinal: Negative.   Musculoskeletal: Negative.   Skin: Negative.   Allergic/Immunologic: Negative.   Neurological: Negative.     All other systems negative unless noted above in HPI  Past medical/social/surgical/family history have been reviewed and are unchanged unless specifically indicated below.  No changes  Medication List: Current Outpatient Medications  Medication Sig Dispense Refill   albuterol (VENTOLIN HFA) 108 (90 Base) MCG/ACT  inhaler Inhale 2 puffs into the lungs every 6 (six) hours as needed for wheezing or shortness of breath. 1 each 11   budesonide-formoterol (SYMBICORT) 160-4.5 MCG/ACT inhaler 2 puffs two times daily with spacer 1 each 5   Fluticasone Propionate (XHANCE) 93 MCG/ACT EXHU 2 sprays each nostril twice a day 32 mL 5   sertraline (ZOLOFT) 50 MG tablet Take 50 mg by mouth daily.     No current facility-administered medications for this visit.     Known medication allergies: Allergies  Allergen Reactions   Cephalexin Rash     Physical examination: Blood pressure 120/82, pulse 80, temperature 97.8 F (36.6 C), resp. rate 16, height 5\' 6"  (1.676 m), weight 244 lb 8 oz (110.9 kg), SpO2 98 %.  General: Alert, interactive, in no acute distress. HEENT: PERRLA, TMs pearly gray, turbinates minimally edematous without discharge, post-pharynx non erythematous. Neck: Supple without lymphadenopathy. Lungs: Clear to auscultation without wheezing, rhonchi or rales. {no increased work of breathing. CV: Normal S1, S2 without murmurs. Abdomen: Nondistended, nontender. Skin: Warm and dry, without lesions or rashes. Extremities:  No clubbing, cyanosis or edema. Neuro:   Grossly intact.  Diagnositics/Labs:  Spirometry: FEV1: 2.17 L 74% , FVC: 2.75 L 79% predicted.  Mildly reduced study for age/demographic  Allergy shots provided today in office.  Assessment and plan: Allergic rhinitis with conjunctivitis   -continue avoidance measures for tree pollens, grass pollens, weed pollens, molds, cat, cockroach, dog and dust mite  -continue Zyrtec 10mg  daily as needed.  If Zyrtec becomes ineffective  then would try next Ryvent 6mg  twice a day which is a prescription antihistamine.   Allegra and Xyzal have not been effective  -Continue nasal saline rinses.  Use distilled water or boiled water and bring down to room temperature prior to use.  Breathe through your mouth through the entire process.  Then follow-up use  with your medicated nasal spray.  -use Xhance 2 sprays each nostril twice a day as needed for nasal congestion  -use of Pataday or Pataday extra strength 1 drop each eye daily as needed for watery, itchy or red eyes.  This is an over-the-counter eyedrop.  - continue allergy shots per protocol.   Mild persistent asthma  - have access to albuterol inhaler 2 puffs every 4-6 hours as needed for cough/wheeze/shortness of breath/chest tightness.  May use 15-20 minutes prior to activity.   Monitor frequency of use.    -continue Symbicort 160 mcg 2 puffs once a day.  Use with spacer.  Increase to twice a day if not meeting the below goals or having a flare  Asthma control goals:  Full participation in all desired activities (may need albuterol before activity) Albuterol use two time or less a week on average (not counting use with activity) Cough interfering with sleep two time or less a month Oral steroids no more than once a year No hospitalizations  Follow-up in 6 months or sooner if needed    I appreciate the opportunity to take part in Allison Daniel's care. Please do not hesitate to contact me with questions.  Sincerely,   , MD Allergy/Immunology [facility]

## 2021-07-17 NOTE — Patient Instructions (Signed)
-  continue avoidance measures for tree pollens, grass pollens, weed pollens, molds, cat, cockroach, dog and dust mite  -continue Zyrtec 10mg  daily as needed.  If Zyrtec becomes ineffective then would try next Ryvent 6mg  twice a day which is a prescription antihistamine.   Allegra and Xyzal have not been effective  -Continue nasal saline rinses.  Use distilled water or boiled water and bring down to room temperature prior to use.  Breathe through your mouth through the entire process.  Then follow-up use with your medicated nasal spray.  -use Xhance 2 sprays each nostril twice a day as needed for nasal congestion  -use of Pataday or Pataday extra strength 1 drop each eye daily as needed for watery, itchy or red eyes.  This is an over-the-counter eyedrop.  - continue allergy shots per protocol.    - have access to albuterol inhaler 2 puffs every 4-6 hours as needed for cough/wheeze/shortness of breath/chest tightness.  May use 15-20 minutes prior to activity.   Monitor frequency of use.    -continue Symbicort 160 mcg 2 puffs once a day.  Use with spacer.  Increase to twice a day if not meeting the below goals or having a flare  Asthma control goals:  Full participation in all desired activities (may need albuterol before activity) Albuterol use two time or less a week on average (not counting use with activity) Cough interfering with sleep two time or less a month Oral steroids no more than once a year No hospitalizations  Follow-up in 6 months or sooner if needed

## 2021-08-03 ENCOUNTER — Other Ambulatory Visit: Payer: Self-pay | Admitting: Allergy

## 2021-08-06 ENCOUNTER — Ambulatory Visit: Payer: Self-pay | Admitting: Nurse Practitioner

## 2021-08-13 DIAGNOSIS — J3081 Allergic rhinitis due to animal (cat) (dog) hair and dander: Secondary | ICD-10-CM | POA: Diagnosis not present

## 2021-08-13 NOTE — Progress Notes (Signed)
VIALS EXP 08-14-22 ?

## 2021-08-30 DIAGNOSIS — M533 Sacrococcygeal disorders, not elsewhere classified: Secondary | ICD-10-CM | POA: Insufficient documentation

## 2021-09-04 DIAGNOSIS — G5702 Lesion of sciatic nerve, left lower limb: Secondary | ICD-10-CM | POA: Insufficient documentation

## 2021-09-20 ENCOUNTER — Ambulatory Visit: Payer: BC Managed Care – PPO | Admitting: Nurse Practitioner

## 2022-01-09 ENCOUNTER — Ambulatory Visit: Payer: BC Managed Care – PPO | Admitting: Allergy

## 2022-04-10 ENCOUNTER — Ambulatory Visit: Payer: BC Managed Care – PPO | Admitting: Allergy

## 2022-05-28 ENCOUNTER — Ambulatory Visit: Payer: BC Managed Care – PPO | Admitting: Allergy

## 2022-09-09 IMAGING — MR MR LUMBAR SPINE W/O CM
4 series · 29 of 48 positions shown · non-contrast
Comparison: None available.

CLINICAL DATA: Initial evaluation for low back pain with radiation
into the left lower extremity.

EXAM:
MRI LUMBAR SPINE WITHOUT CONTRAST
TECHNIQUE: Multiplanar, multisequence MR imaging of the lumbar spine was
performed. No intravenous contrast was administered.

[Series 3: T2 · sagittal · 4.0mm · 1.09mm/px · 7 of 17 slices shown (1 of 2)]
[im 1/17]
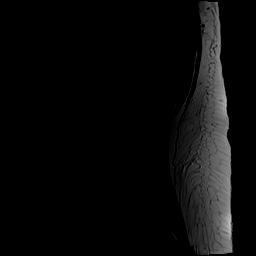
[im 3/17]
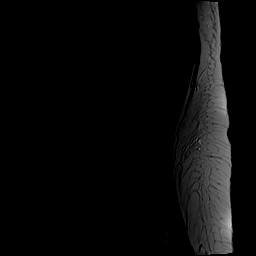
[im 6/17]
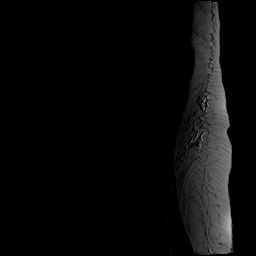
[im 9/17]
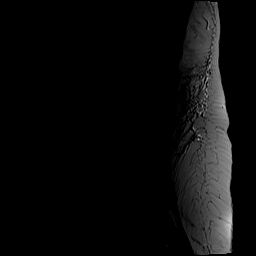
[im 11/17]
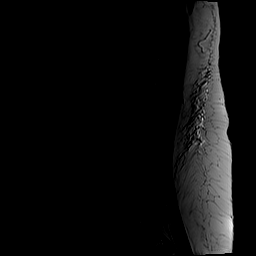
[im 14/17]
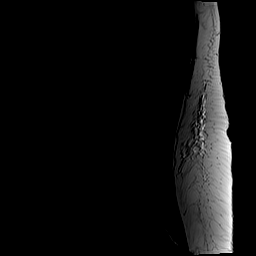
[im 17/17]
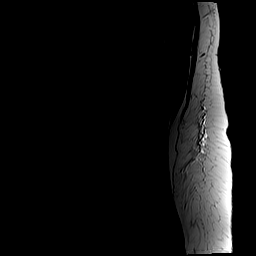

[Series 5: T1 · sagittal · 4.0mm · 1.09mm/px · 7 of 17 slices shown (1 of 2)]
[im 1/17]
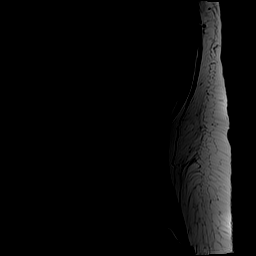
[im 3/17]
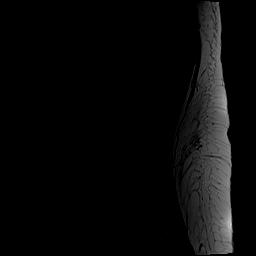
[im 6/17]
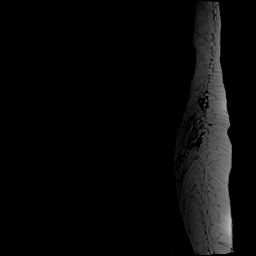
[im 9/17]
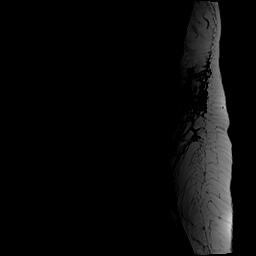
[im 11/17]
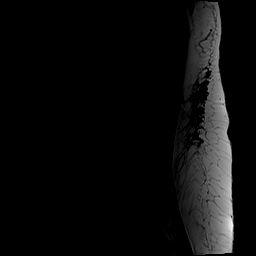
[im 14/17]
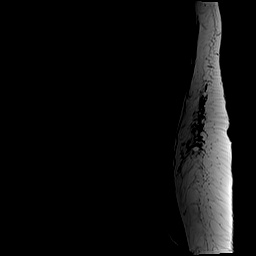
[im 17/17]
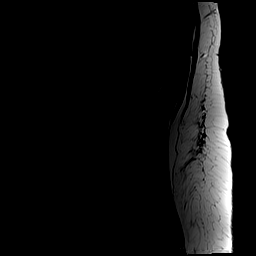

[Series 6: T2 · axial · 4.0mm · 0.39mm/px · z∈[-133,+53]mm · 11 of 42 slices shown (2 of 2)]
[im 3/42]
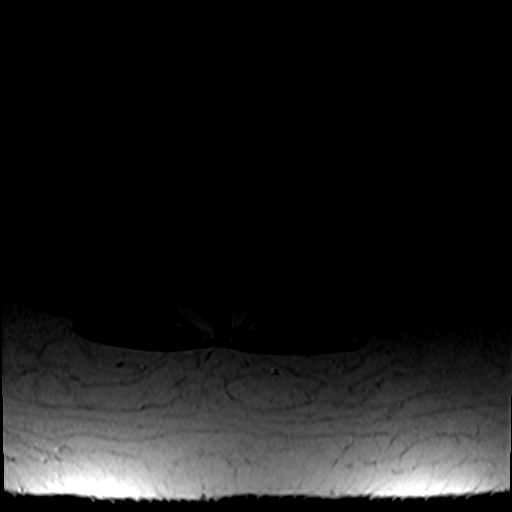
[im 6/42]
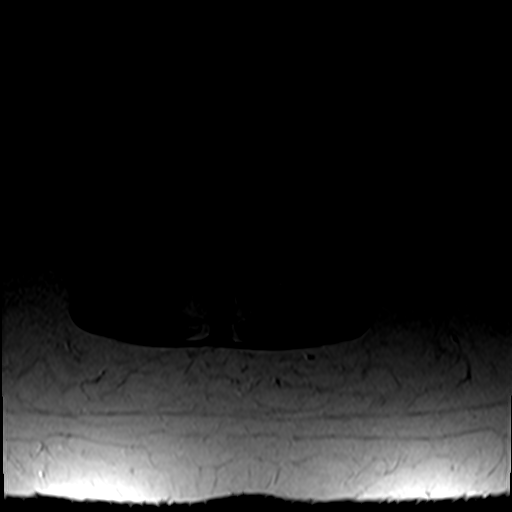
[im 8/42]
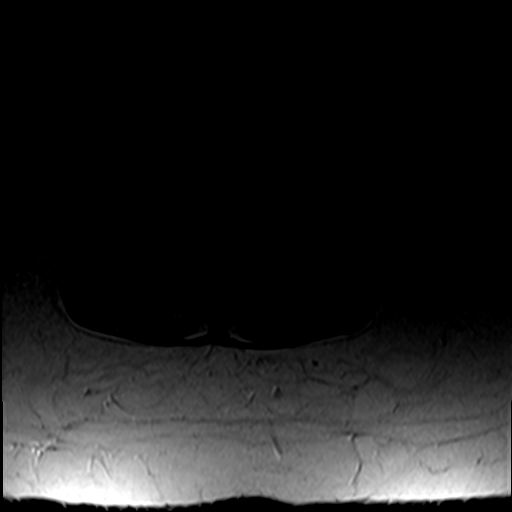
[im 13/42]
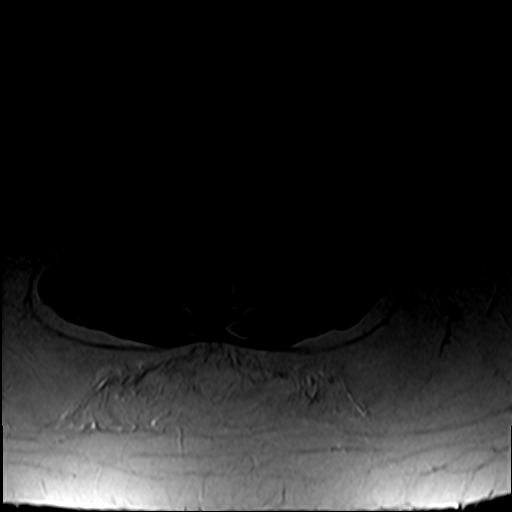
[im 18/42]
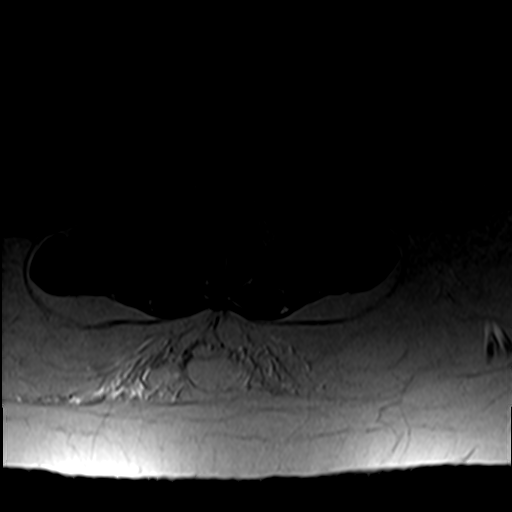
[im 21/42]
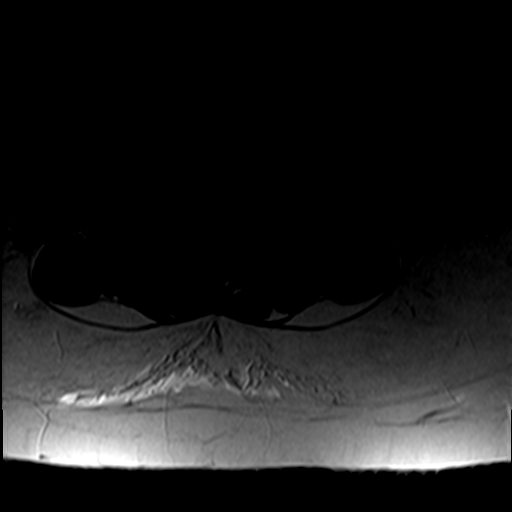
[im 24/42]
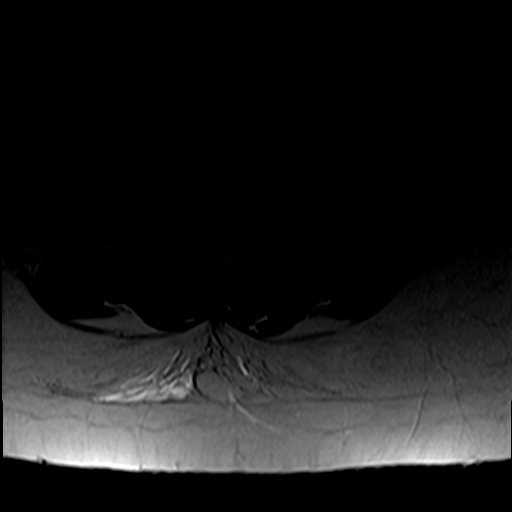
[im 29/42]
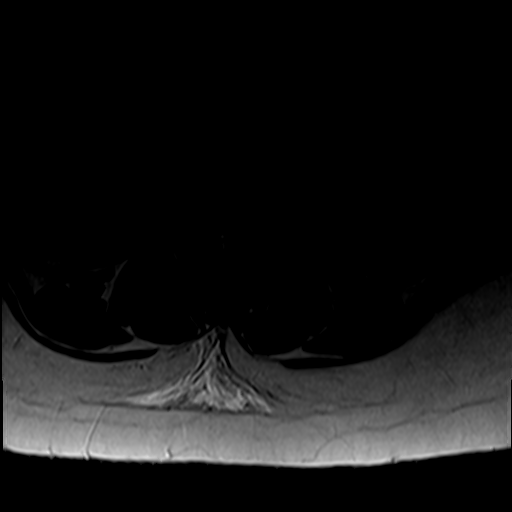
[im 34/42]
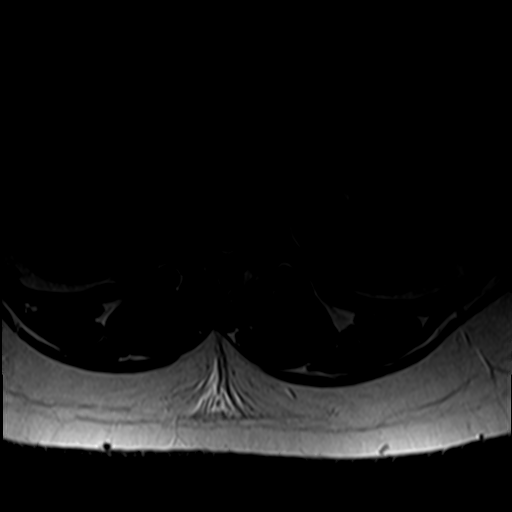
[im 36/42]
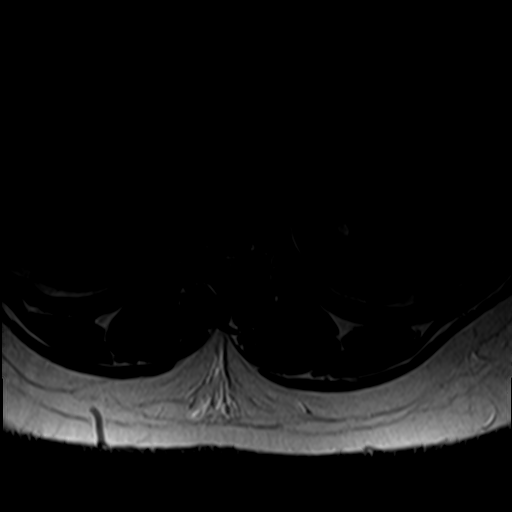
[im 39/42]
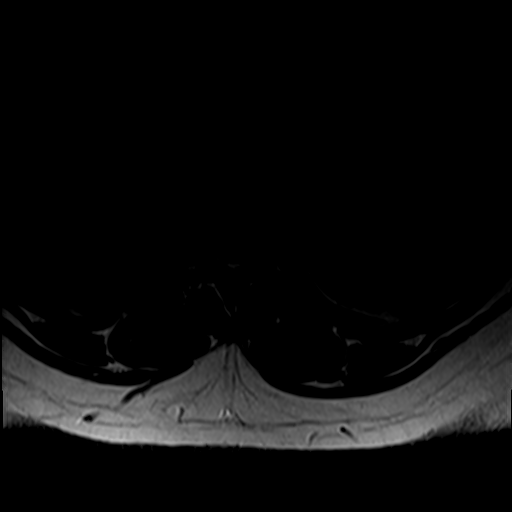

[Series 7: T1 · axial · 4.0mm · 0.39mm/px · z∈[-133,+38]mm · 4 of 42 slices shown (2 of 2)]
[im 3/42]
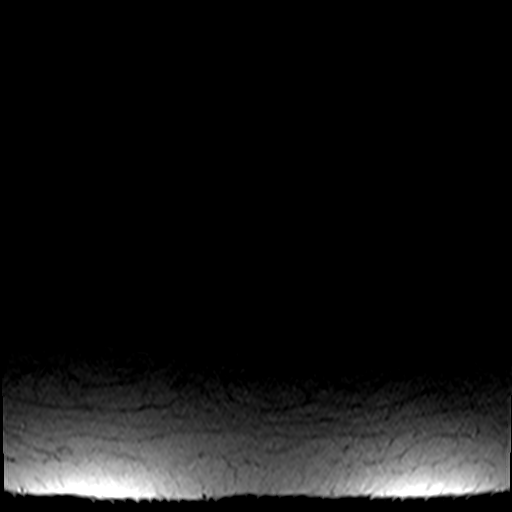
[im 6/42]
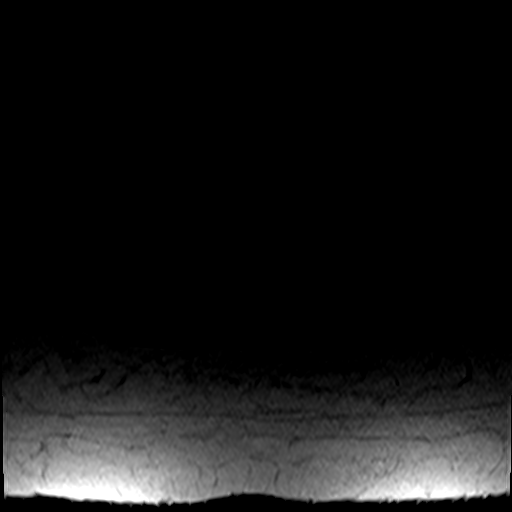
[im 21/42]
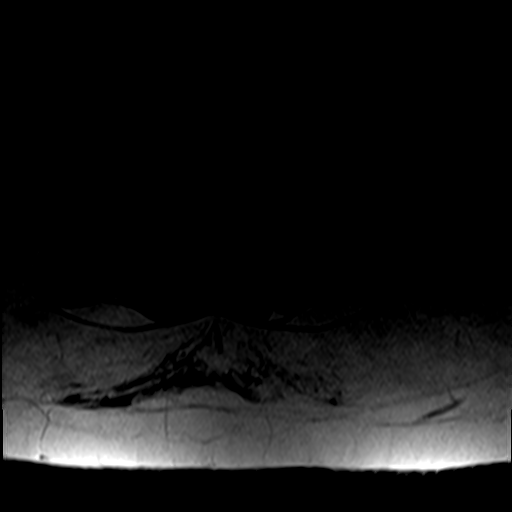
[im 36/42]
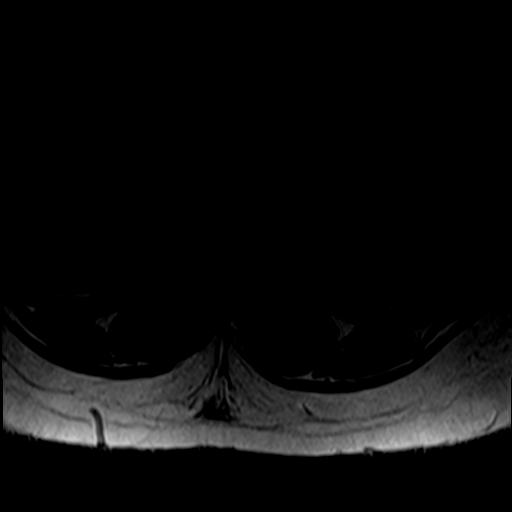

[29 of 48 positions shown; findings below may reference images not displayed]

FINDINGS: Segmentation: Transitional features seen about the lumbosacral
junction. For the purposes of this dictation, lowest well-formed
disc space labeled the L5-S1 level.

Alignment: Mild dextroscoliosis. Alignment otherwise normal with
preservation of the normal lumbar lordosis.

Vertebrae: Vertebral body height maintained without acute or chronic
fracture. Bone marrow signal intensity within normal limits. No
discrete or worrisome osseous lesions. No abnormal marrow edema.

Conus medullaris and cauda equina: Conus extends to the L1-2 level.
Conus and cauda equina appear normal.

Paraspinal and other soft tissues: Unremarkable.

Disc levels:

No significant disc pathology seen within the lumbar spine. No disc
bulge or focal disc herniation. Mild lower lumbar facet hypertrophy
at L4-5 and L5-S1. No significant spinal stenosis. Neural foramina
remain patent. No neural impingement.
IMPRESSION: 1. Mild lower lumbar facet hypertrophy at L4-5 and L5-S1.
2. Otherwise normal MRI of the lumbar spine. No significant disc
pathology, stenosis, or evidence for neural impingement.

## 2024-04-07 ENCOUNTER — Ambulatory Visit: Admission: EM | Admit: 2024-04-07 | Discharge: 2024-04-07 | Disposition: A | Discharge status: Home / Self Care

## 2024-04-07 DIAGNOSIS — R110 Nausea: Secondary | ICD-10-CM | POA: Diagnosis not present

## 2024-04-07 DIAGNOSIS — R079 Chest pain, unspecified: Secondary | ICD-10-CM

## 2024-04-07 DIAGNOSIS — J069 Acute upper respiratory infection, unspecified: Secondary | ICD-10-CM

## 2024-04-07 DIAGNOSIS — R6889 Other general symptoms and signs: Secondary | ICD-10-CM | POA: Diagnosis not present

## 2024-04-07 DIAGNOSIS — M545 Low back pain, unspecified: Secondary | ICD-10-CM

## 2024-04-07 DIAGNOSIS — G8929 Other chronic pain: Secondary | ICD-10-CM

## 2024-04-07 HISTORY — DX: Other specified abnormal immunological findings in serum: R76.89

## 2024-04-07 HISTORY — DX: Discoid lupus erythematosus: L93.0

## 2024-04-07 LAB — POC COVID19/FLU A&B COMBO
Covid Antigen, POC: NEGATIVE
Influenza A Antigen, POC: NEGATIVE
Influenza B Antigen, POC: NEGATIVE

## 2024-04-07 MED ORDER — IBUPROFEN 600 MG PO TABS: 600.0000 mg | ORAL_TABLET | Freq: Three times a day (TID) | ORAL | 0 refills | Status: AC | PRN

## 2024-04-07 MED ORDER — IBUPROFEN 800 MG PO TABS
800.0000 mg | ORAL_TABLET | Freq: Once | ORAL | Status: AC
  Administered 2024-04-07: 800 mg via ORAL

## 2024-04-07 MED ORDER — ONDANSETRON 4 MG PO TBDP: 4.0000 mg | ORAL_TABLET | Freq: Three times a day (TID) | ORAL | 0 refills | Status: AC | PRN

## 2024-04-07 NOTE — Discharge Instructions (Addendum)
 Your EKG today is normal.  The chest pain you are experiencing at this time is most likely due to respiratory illness.  Your rapid influenza and COVID-19 antigen test today was negative.  No further influenza testing is indicated.  Please consider retesting for COVID-19 in the next 2 to 3 days, particularly if you are not feeling any better.  You are welcome to return here to urgent care to repeat the test or you can take a home COVID-19 test.     If both your COVID-19 tests are negative, then you can safely assume that your illness is due to one of the many less serious illnesses circulating in our community right now.     Conservative care is recommended with rest, drinking plenty of clear fluids, eating only when hungry, taking supportive medications for your symptoms and avoiding being around other people.  Please remain at home until you are fever free for 24 hours without the use of antifever medications such as Tylenol and ibuprofen.   Please read below to learn more about the medications, dosages and frequencies that I recommend to help alleviate your symptoms and to get you feeling better soon:   Zyrtec (cetirizine): This is an excellent second-generation antihistamine that helps to reduce respiratory inflammatory response to environmental allergens.  In some patients, this medication can cause daytime sleepiness so I recommend that you take 1 tablet daily at bedtime.                                 Nasonex (mometasone): This is a steroid nasal spray that used once daily, 1 spray in each nare.  This works best when used on a daily basis. This medication does not work well if it is only used when you think you need it.  After 3 to 5 days of use, you will notice significant reduction of the inflammation and mucus production that is currently being caused by exposure to allergens, whether seasonal or environmental.  The most common side effect of this medication is nosebleeds.  If you experience a  nosebleed, please discontinue use for 1 week, then feel free to resume.           ProAir , Ventolin , Proventil  (albuterol ): This inhaled medication contains a short acting beta agonist bronchodilator.  This medication relaxes the smooth muscle of the airway in the lungs.  When these muscles are tight, breathing becomes more constricted.  The result of relaxation of the smooth muscle is increased air movement and improved work of breathing.  This is a short acting medication that can be used every 4-6 hours as needed for increased work of breathing, shortness of breath, wheezing and excessive coughing.  It comes in the form of a handheld inhaler or nebulizer solution.  I recommended that for the next 3 to 4 days, this medication is used 4 times daily on a scheduled basis then decrease to twice daily and as needed until symptoms have completely resolved which I anticipate will be several weeks.        Symbicort  (budesonide  and formoterol ): Please inhale 2 puffs twice daily.  This inhaled medication contains a corticosteroid and long-acting form of albuterol .  The inhaled steroid and this medication  is not absorbed into the body and will not cause side effects such as increased blood sugar levels, irritability, sleeplessness or weight gain.  Inhaled corticosteroid are sort of like topical steroid creams but, as  you can imagine, it is not practical to attempt to rub a steroid cream inside of your lungs.  The long-acting albuterol  works similarly to the short acting albuterol  found in your rescue inhaler but provides 24-hour relaxation of the smooth muscles that open and constrict your airways; your short acting rescue inhaler can only provide for a few hours this benefit for a few hours.  Please feel free to inhale 2 more puffs every 4 hours as needed for shortness of breath, wheezing and cough throughout the day until you are feeling better.      DayQuil Cough DM Plus Congestion liquid:  This is an  over-the-counter multisymptom preparation that contains a cough suppressant, dextromethorphan 20 mg, an expectorant which loosens congestion to make coughing easier, guaifenesin 400 mg, and a decongestant intended to decrease mucous production, phenylephrine 10 mg.  Please keep in mind that the FDA recently stated that phenylephrine has been shown to be ineffective.  My personal recommendation is to avoid combination medications such as this one and to instead choose your own combination of the single symptom relievers listed in this summary.     NyQuil Severe Cold and Flu Liquid, NyQuil VapoCOOL Caplets / Tylenol Cold + Flu + Cough Nighttime Liquid: These are identical over-the-counter multisymptom medications that contain a fever/pain reliever, acetaminophen 650 mg, a cough suppressant, dextromethorphan 30 mg, and an antihistamine commonly found and sleep medications such as Unisom and Sleep-Aid that dries out mucus production and helps you sleep, doxylamine 12.5 mg.  Please do not take this medication along with other cough suppressants with the letters DM or with other antihistamines such as Benadryl, Zyrtec, Xyzal, Allegra  or Claritin to avoid overdose.  Please be careful when taking this medication along with other medications containing acetaminophen or Tylenol.  The maximum dose of Tylenol in a 24-hour period is 3000 mg, taking more than 3000 mg can damage your liver.        Zofran (ondansetron): This is a good antinausea medication that you can take every 8 hours as needed for symptoms of nausea and vomiting.  It is a dissolvable tablet that you do not need to take with water, just put it under your tongue and let it melt away.  I have sent a prescription to your pharmacy.   Advil, Motrin (ibuprofen): This is a good anti-inflammatory medication which addresses aches, pains and inflammation of the upper airways as well as lower back pain.  I recommend that you take  600 mg every 6-8 hours as needed;  your next dose can be taken around 10 PM tonight..  Please do not take more than 2400 mg of ibuprofen in a 24-hour period and please do not take high doses of ibuprofen for more than 3 days in a row as this can lead to stomach ulcers.   If symptoms have not meaningfully improved in the next 5 to 7 days, please return for repeat evaluation or follow-up with your regular provider.  If symptoms have worsened in the next 3 to 5 days, please go to the emergency room for further evaluation.    Thank you for visiting urgent care today.  We appreciate the opportunity to participate in your care.

## 2024-04-07 NOTE — ED Provider Notes (Addendum)
 [facility]    CSN: [id number] Arrival date & time: 04/07/24  1352    HISTORY   Chief Complaint  Patient presents with   Back Pain   Chest Pain   HPI Allison Daniel is a pleasant, 33 y.o. female who presents to urgent care today. Patient states that 6 days ago she began to feel fatigue.  She initially contributed this to the recent weather changes and daily, cold rain we have been having.  Patient states that she had a scratchy throat nasal congestion 6 days ago as well but this improved the next day.  Patient states that throughout the week, she has been having progressively worsening body aches, intermittent coughing, pain in her chest with coughing and taking deep breaths, chills without fever and lower back pain.  Patient states she has been taking DayQuil and giving herself nebulized albuterol  treatments.  States the DayQuil provides temporary relief but the albuterol  is not working well as it normally does.  Patient states that she has Symbicort  at home but was not aware that she was supposed to use it daily.  Patient states she also took Zyrtec and used Nasonex nasal spray last night without meaningful relief.  Patient states that today she began to also feel nauseated and to have shortness of breath with exertion.  Patient denies known history of heart disease but states the pain she is experiencing in her chest at this time feels different than she feels when she is having an asthma flareup.  Patient denies known sick contacts, nausea, vomiting, diarrhea, otalgia, sinus pain or pressure.     The history is provided by the patient.  Back Pain Associated symptoms: chest pain   Chest Pain Associated symptoms: back pain    Past Medical History:  Diagnosis Date   ANA positive    Asthma    Depression    Discoid lupus erythematosus    Frequent headaches    GERD (gastroesophageal reflux disease)    Migraine    UTI (urinary tract infection)    Patient Active Problem  List   Diagnosis Date Noted   Piriformis syndrome of left side 09/04/2021   Sacroiliac joint dysfunction of left side 08/30/2021   Vitamin D deficiency 07/09/2021   Anemia 07/25/2020   ANA positive 12/28/2019   Mixed anxiety and depressive disorder 11/23/2019   Anxiety 10/12/2019   Abnormal cervical Papanicolaou smear 03/16/2019   Allergic conjunctivitis and rhinitis 11/17/2014   Allergic rhinitis 11/17/2014   Moderate persistent asthma with acute exacerbation 11/17/2014   Asthma 03/02/2012   Mild intermittent asthma 03/02/2012   Past Surgical History:  Procedure Laterality Date   SINOSCOPY     SINUS IRRIGATION  03/2018   drained mucous pockets and fixed septum    OB History     Gravida  0   Para  0   Term  0   Preterm  0   AB  0   Living  0      SAB  0   IAB  0   Ectopic  0   Multiple  0   Live Births  0          Home Medications    Prior to Admission medications   Medication Sig Start Date End Date Taking? Authorizing Provider  AIRSUPRA 90-80 MCG/ACT AERO INHALE 2 PUFFS EVERY 4 TO 6 HOURS AS NEEDED FOR COUGH, WHEEZING OR SHORTNESS OF BREATH 02/01/24  Yes [provider]  albuterol  (VENTOLIN  HFA) 108 (  90 Base) MCG/ACT inhaler Inhale 2 puffs into the lungs every 6 (six) hours as needed for wheezing or shortness of breath. 07/12/20  Yes Padgett, Danita Macintosh, MD  budesonide -formoterol  (SYMBICORT ) 160-4.5 MCG/ACT inhaler 2 PUFFS TWO TIMES DAILY WITH SPACER 08/05/21  Yes Padgett, Danita Macintosh, MD  cetirizine (ZYRTEC) 10 MG tablet TAKE 2 TABLETS BY MOUTH IN THE MORNING AND 1 AT NIGHT 06/23/22  Yes [provider]  EPINEPHrine  0.3 mg/0.3 mL IJ SOAJ injection    Yes [provider]  hydrOXYzine (ATARAX) 25 MG tablet Take 1 tablet by mouth. 06/23/22  Yes [provider]  Misc. Devices MISC Nebulizer compressor system 11/07/22  Yes [provider]  Multiple Vitamins-Minerals (MULTIVITAMIN ADULTS PO) Take by mouth.    Yes [provider]  cyclobenzaprine (FLEXERIL) 10 MG tablet Take 1 tablet by mouth 3 (three) times daily. Patient not taking: Reported on 04/07/2024    [provider]  diclofenac (VOLTAREN) 75 MG EC tablet Take 1 tablet twice a day by oral route with meals for 10 days. Patient not taking: Reported on 04/07/2024    [provider]  dicyclomine (BENTYL) 10 MG capsule Take 10 mg by mouth. Patient not taking: Reported on 04/07/2024 08/13/23   [provider]  Ferrous Sulfate (IRON PO) TAKE 1 TABLET BY MOUTH ONCE DAILY. TAKE WITH FOOD IF UPSET STOMACH Patient not taking: Reported on 04/07/2024 12/23/23   [provider]  fluconazole  (DIFLUCAN ) 150 MG tablet TK 1 T PO AS DIRECTED REPEAY IN 7 DAYS Patient not taking: Reported on 04/07/2024    [provider]  Fluticasone Propionate  (XHANCE ) 93 MCG/ACT EXHU 2 sprays each nostril twice a day Patient not taking: Reported on 04/07/2024 04/11/20   Jeneal Danita Macintosh, MD  Fluticasone-Umeclidin-Vilant 200-62.5-25 MCG/ACT AEPB INHALE 1 PUFF IN THE MORNING AS DIRECTED AND RINSE/GARGLE AFTER USE Patient not taking: Reported on 04/07/2024    [provider]  hydrochlorothiazide (HYDRODIURIL) 12.5 MG tablet Take 12.5 mg by mouth. Patient not taking: Reported on 04/07/2024 08/24/23   [provider]  hydrOXYzine (ATARAX) 10 MG tablet TAKE 1 TABLET BY MOUTH THREE TIMES DAILY AS NEEDED FOR ITCHING FOR 10 DAYS Patient not taking: Reported on 04/07/2024    [provider]  meloxicam (MOBIC) 15 MG tablet meloxicam 15 mg tablet  TAKE ONE TABLET (15 MG DOSE) BY MOUTH DAILY. Patient not taking: Reported on 04/07/2024    [provider]  metroNIDAZOLE (FLAGYL) 500 MG tablet Take 1 tablet twice a day by oral route for 7 days. Patient not taking: Reported on 04/07/2024    [provider]  montelukast (SINGULAIR) 10 MG tablet Take 1 tablet by mouth daily. Patient not  taking: Reported on 04/07/2024 06/04/17   [provider]  nitrofurantoin (MACRODANTIN) 100 MG capsule Take 1 capsule every 12 hours by oral route for 7 days. Patient not taking: Reported on 04/07/2024    [provider]  nitrofurantoin, macrocrystal-monohydrate, (MACROBID) 100 MG capsule     [provider]  ondansetron (ZOFRAN) 4 MG tablet Take 4 mg by mouth every 8 (eight) hours as needed. Patient not taking: Reported on 04/07/2024 04/07/24 04/11/24  [provider]  ondansetron (ZOFRAN) 8 MG tablet Take by mouth. Patient not taking: Reported on 04/07/2024 09/08/23   [provider]  phenazopyridine (PYRIDIUM) 100 MG tablet Take 1 tablet 3 times a day by oral route for 2 days. Patient not taking: Reported on 04/07/2024    [provider]  predniSONE (DELTASONE) 10 MG tablet Take 10 mg by mouth. Patient not taking: Reported on 04/07/2024    [provider]  Prenatal Vit-Fe Fumarate-FA (MULTIVITAMIN-PRENATAL) 27-0.8 MG TABS tablet Take 1 tablet by mouth daily. Patient not taking: Reported on 04/07/2024 12/23/23   [provider]  Prenatal Vit-Fe Fumarate-FA (PRENATAL VITAMIN PLUS LOW IRON) 27-1 MG TABS Take 1 tablet by mouth. Patient not taking: Reported on 04/07/2024 12/22/23   [provider]  sertraline (ZOLOFT) 50 MG tablet Take 50 mg by mouth daily. Patient not taking: Reported on 04/07/2024    [provider]  tacrolimus (PROTOPIC) 0.1 % ointment Apply 1 Application topically. Patient not taking: Reported on 04/07/2024 03/28/24   [provider]  triamcinolone (KENALOG) 0.025 % cream     [provider]  triamcinolone cream (KENALOG) 0.1 % APPLY 1.3 GRAM TO AFFECTED AREA TWICE DAILY AVOID USE ON FACE, NECK, ARMPITS, AND GROIN Patient not taking: Reported on 04/07/2024    [provider]    Family History Family History  Problem Relation Age of Onset   Hyperlipidemia  Mother    Hypertension Mother    Allergic rhinitis Mother    Aneurysm Father    Alcohol abuse Maternal Grandmother    Drug abuse Maternal Grandmother    Allergic rhinitis Maternal Grandmother    Arthritis Paternal Grandmother    Diabetes Paternal Grandmother    Angioedema Neg Hx    Asthma Neg Hx    Atopy Neg Hx    Eczema Neg Hx    Immunodeficiency Neg Hx    Urticaria Neg Hx    Social History Social History   Tobacco Use   Smoking status: Never    Passive exposure: Past   Smokeless tobacco: Never  Vaping Use   Vaping status: Never Used  Substance Use Topics   Alcohol use: Not Currently    Comment: occasionally   Drug use: Never   Allergies   Citalopram and Cephalexin  Review of Systems Review of Systems  Cardiovascular:  Positive for chest pain.  Musculoskeletal:  Positive for back pain.   Pertinent findings revealed after performing a 14 point review of systems has been noted in the history of present illness.  Physical Exam Vital Signs BP 124/82 (BP Location: Right Arm)   Pulse 73   Temp 98 F (36.7 C) (Oral)   Resp 18   LMP 03/17/2024   SpO2 96%   No data found.  Physical Exam Vitals and nursing note reviewed.  Constitutional:      General: She is awake. She is not in acute distress.    Appearance: Normal appearance. She is well-developed and well-groomed. She is not ill-appearing.  HENT:     Head: Normocephalic and atraumatic.     Salivary Glands: Right salivary gland is not diffusely enlarged or tender. Left salivary gland is not diffusely enlarged or tender.     Right Ear: Hearing, tympanic membrane, ear canal and external ear normal.     Left Ear: Hearing, tympanic membrane, ear canal and external ear normal.     Nose: Nose normal.     Right Turbinates: Not enlarged, swollen or pale.     Left Turbinates: Not enlarged, swollen or pale.     Right Sinus: No maxillary sinus tenderness or frontal sinus tenderness.     Left Sinus: No maxillary sinus  tenderness or frontal sinus tenderness.     Mouth/Throat:     Lips: Pink. No lesions.  Mouth: Mucous membranes are moist. No oral lesions.     Tongue: No lesions. Tongue does not deviate from midline.     Palate: No mass and lesions.     Pharynx: Oropharynx is clear. Uvula midline. No pharyngeal swelling, oropharyngeal exudate, posterior oropharyngeal erythema, uvula swelling or postnasal drip.     Tonsils: No tonsillar exudate. 0 on the right. 0 on the left.  Eyes:     General: Lids are normal.        Right eye: No discharge.        Left eye: No discharge.     Conjunctiva/sclera: Conjunctivae normal.     Right eye: Right conjunctiva is not injected.     Left eye: Left conjunctiva is not injected.  Neck:     Trachea: Trachea and phonation normal.  Cardiovascular:     Rate and Rhythm: Normal rate and regular rhythm.  Pulmonary:     Effort: Pulmonary effort is normal.     Breath sounds: Normal breath sounds.  Chest:     Chest wall: No tenderness.  Musculoskeletal:        General: Normal range of motion.     Cervical back: Normal, full passive range of motion without pain, normal range of motion and neck supple.     Thoracic back: Normal.     Lumbar back: Spasms and tenderness present.       Back:  Lymphadenopathy:     Cervical: No cervical adenopathy.  Skin:    General: Skin is warm and dry.     Findings: No erythema or rash.  Neurological:     General: No focal deficit present.     Mental Status: She is alert and oriented to person, place, and time. Mental status is at baseline.  Psychiatric:        Attention and Perception: Attention and perception normal.        Mood and Affect: Mood and affect normal.        Speech: Speech normal.        Behavior: Behavior normal. Behavior is cooperative.        Thought Content: Thought content normal.     Visual Acuity Right Eye Distance:   Left Eye Distance:   Bilateral Distance:    Right Eye Near:   Left Eye Near:     Bilateral Near:     [facility]ouse / Diagnostics / Procedures:     Radiology No results found.  Procedures ED EKG  Date/Time: 04/07/2024 2:53 PM  Performed by: Joesph Shaver Scales, PA-C Authorized by: Joesph Shaver Scales, PA-C   ECG interpreted by ED Physician in the absence of a cardiologist: yes   Previous ECG:    Previous ECG:  Compared to current   Similarity:  No change Interpretation:    Interpretation: normal   Rate:    ECG rate:  69   ECG rate assessment: normal   Rhythm:    Rhythm: sinus rhythm   Ectopy:    Ectopy: none   QRS:    QRS axis:  Normal   QRS intervals:  Normal   QRS conduction: normal   ST segments:    ST segments:  Normal T waves:    T waves: normal   Q waves:    Abnormal Q-waves: not present   Comments:     Normal ekg  (including critical care time) EKG  Pending results:  Labs Reviewed  POC COVID19/FLU A&B COMBO    Medications Ordered  in UC: Medications  ibuprofen (ADVIL) tablet 800 mg (800 mg Oral Given 04/07/24 1433)    UC Diagnoses / Final Clinical Impressions(s)   I have reviewed the triage vital signs and the nursing notes.  Pertinent labs & imaging results that were available during my care of the patient were reviewed by me and considered in my medical decision making (see chart for details).    Final diagnoses:  Feeling unwell  Chronic left-sided low back pain, unspecified whether sciatica present  Viral upper respiratory tract infection  Nausea without vomiting  Chest pain, unspecified type   Pt advised EKG today is normal.   COVID-19 and influenza test today were negative.  Patient encouraged to repeat COVID-19 test in 3 days if feeling no better.  Physical exam findings not concerning for acute asthma exacerbation but patient was advised can increase her frequency of use of Symbicort  to every 4 hours as needed if she finds that albuterol  alone is not relieving her symptoms.    Patient was provided with a  prescription for Zofran 4 mg for relief of of her nausea.  Patient was provided with ibuprofen 100 mg during her visit today and a prescription for the 600 mg tablets that she can take every 6-8 hours as needed.  Patient advised if back pain is worsening despite this, she should follow-up with her back specialist for further evaluation and treatment.  Please see discharge instructions below for details of plan of care as provided to patient. ED Prescriptions     Medication Sig Dispense Auth. Provider   ondansetron (ZOFRAN-ODT) 4 MG disintegrating tablet Take 1 tablet (4 mg total) by mouth every 8 (eight) hours as needed for nausea or vomiting. 20 tablet Joesph Shaver Scales, PA-C   ibuprofen (ADVIL) 600 MG tablet Take 1 tablet (600 mg total) by mouth every 8 (eight) hours as needed for up to 30 doses for fever, headache, mild pain (pain score 1-3) or moderate pain (pain score 4-6) (Inflammation). Take 1 tablet 3 times daily as needed for inflammation of upper airways and/or pain. 30 tablet Joesph Shaver Scales, PA-C      PDMP not reviewed this encounter.  Pending results:  Labs Reviewed  POC COVID19/FLU A&B COMBO      Discharge Instructions      Your EKG today is normal.  The chest pain you are experiencing at this time is most likely due to respiratory illness.  Your rapid influenza and COVID-19 antigen test today was negative.  No further influenza testing is indicated.  Please consider retesting for COVID-19 in the next 2 to 3 days, particularly if you are not feeling any better.  You are welcome to return here to urgent care to repeat the test or you can take a home COVID-19 test.     If both your COVID-19 tests are negative, then you can safely assume that your illness is due to one of the many less serious illnesses circulating in our community right now.     Conservative care is recommended with rest, drinking plenty of clear fluids, eating only when hungry, taking  supportive medications for your symptoms and avoiding being around other people.  Please remain at home until you are fever free for 24 hours without the use of antifever medications such as Tylenol and ibuprofen.   Please read below to learn more about the medications, dosages and frequencies that I recommend to help alleviate your symptoms and to get you feeling better soon:  Zyrtec (cetirizine): This is an excellent second-generation antihistamine that helps to reduce respiratory inflammatory response to environmental allergens.  In some patients, this medication can cause daytime sleepiness so I recommend that you take 1 tablet daily at bedtime.                                 Nasonex (mometasone): This is a steroid nasal spray that used once daily, 1 spray in each nare.  This works best when used on a daily basis. This medication does not work well if it is only used when you think you need it.  After 3 to 5 days of use, you will notice significant reduction of the inflammation and mucus production that is currently being caused by exposure to allergens, whether seasonal or environmental.  The most common side effect of this medication is nosebleeds.  If you experience a nosebleed, please discontinue use for 1 week, then feel free to resume.           ProAir , Ventolin , Proventil  (albuterol ): This inhaled medication contains a short acting beta agonist bronchodilator.  This medication relaxes the smooth muscle of the airway in the lungs.  When these muscles are tight, breathing becomes more constricted.  The result of relaxation of the smooth muscle is increased air movement and improved work of breathing.  This is a short acting medication that can be used every 4-6 hours as needed for increased work of breathing, shortness of breath, wheezing and excessive coughing.  It comes in the form of a handheld inhaler or nebulizer solution.  I recommended that for the next 3 to 4 days, this medication is used  4 times daily on a scheduled basis then decrease to twice daily and as needed until symptoms have completely resolved which I anticipate will be several weeks.        Symbicort  (budesonide  and formoterol ): Please inhale 2 puffs twice daily.  This inhaled medication contains a corticosteroid and long-acting form of albuterol .  The inhaled steroid and this medication  is not absorbed into the body and will not cause side effects such as increased blood sugar levels, irritability, sleeplessness or weight gain.  Inhaled corticosteroid are sort of like topical steroid creams but, as you can imagine, it is not practical to attempt to rub a steroid cream inside of your lungs.  The long-acting albuterol  works similarly to the short acting albuterol  found in your rescue inhaler but provides 24-hour relaxation of the smooth muscles that open and constrict your airways; your short acting rescue inhaler can only provide for a few hours this benefit for a few hours.  Please feel free to inhale 2 more puffs every 4 hours as needed for shortness of breath, wheezing and cough throughout the day until you are feeling better.      DayQuil Cough DM Plus Congestion liquid:  This is an over-the-counter multisymptom preparation that contains a cough suppressant, dextromethorphan 20 mg, an expectorant which loosens congestion to make coughing easier, guaifenesin 400 mg, and a decongestant intended to decrease mucous production, phenylephrine 10 mg.  Please keep in mind that the FDA recently stated that phenylephrine has been shown to be ineffective.  My personal recommendation is to avoid combination medications such as this one and to instead choose your own combination of the single symptom relievers listed in this summary.     NyQuil Severe Cold and Flu Liquid, NyQuil VapoCOOL Caplets /  Tylenol Cold + Flu + Cough Nighttime Liquid: These are identical over-the-counter multisymptom medications that contain a fever/pain reliever,  acetaminophen 650 mg, a cough suppressant, dextromethorphan 30 mg, and an antihistamine commonly found and sleep medications such as Unisom and Sleep-Aid that dries out mucus production and helps you sleep, doxylamine 12.5 mg.  Please do not take this medication along with other cough suppressants with the letters DM or with other antihistamines such as Benadryl, Zyrtec, Xyzal, Allegra  or Claritin to avoid overdose.  Please be careful when taking this medication along with other medications containing acetaminophen or Tylenol.  The maximum dose of Tylenol in a 24-hour period is 3000 mg, taking more than 3000 mg can damage your liver.        Zofran (ondansetron): This is a good antinausea medication that you can take every 8 hours as needed for symptoms of nausea and vomiting.  It is a dissolvable tablet that you do not need to take with water, just put it under your tongue and let it melt away.  I have sent a prescription to your pharmacy.   Advil, Motrin (ibuprofen): This is a good anti-inflammatory medication which addresses aches, pains and inflammation of the upper airways as well as lower back pain.  I recommend that you take  600 mg every 6-8 hours as needed; your next dose can be taken around 10 PM tonight..  Please do not take more than 2400 mg of ibuprofen in a 24-hour period and please do not take high doses of ibuprofen for more than 3 days in a row as this can lead to stomach ulcers.   If symptoms have not meaningfully improved in the next 5 to 7 days, please return for repeat evaluation or follow-up with your regular provider.  If symptoms have worsened in the next 3 to 5 days, please go to the emergency room for further evaluation.    Thank you for visiting urgent care today.  We appreciate the opportunity to participate in your care.       Disposition Upon Discharge:  Condition: stable for discharge home  Patient presented with an acute illness with associated systemic symptoms and  significant discomfort requiring urgent management. In my opinion, this is a condition that a prudent lay person (someone who possesses an average knowledge of health and medicine) may potentially expect to result in complications if not addressed urgently such as respiratory distress, impairment of bodily function or dysfunction of bodily organs.   Routine symptom specific, illness specific and/or disease specific instructions were discussed with the patient and/or caregiver at length.   As such, the patient has been evaluated and assessed, work-up was performed and treatment was provided in alignment with urgent care protocols and evidence based medicine.  Patient/parent/caregiver has been advised that the patient may require follow up for further testing and treatment if the symptoms continue in spite of treatment, as clinically indicated and appropriate.  Patient/parent/caregiver has been advised to return to the [facility] or PCP if no better; to PCP or the Emergency Department if new signs and symptoms develop, or if the current signs or symptoms continue to change or worsen for further workup, evaluation and treatment as clinically indicated and appropriate  The patient will follow up with their current PCP if and as advised. If the patient does not currently have a PCP we will assist them in obtaining one.   The patient may need specialty follow up if the symptoms continue, in spite of conservative treatment  and management, for further workup, evaluation, consultation and treatment as clinically indicated and appropriate.  Patient/parent/caregiver verbalized understanding and agreement of plan as discussed.  All questions were addressed during visit.  Please see discharge instructions below for further details of plan.  This office note has been dictated using Teaching laboratory technician.  Unfortunately, this method of dictation can sometimes lead to typographical or grammatical errors.  I  apologize for your inconvenience in advance if this occurs.  Please do not hesitate to reach out to me if clarification is needed.      Joesph Shaver Scales, PA-C 04/07/24 1445    Joesph Shaver Scales, PA-C 04/07/24 1459    Joesph Shaver Scales, PA-C 04/07/24 1500

## 2024-04-07 NOTE — ED Triage Notes (Addendum)
 Patient states since yesterday she has been having a headache, cough, fatigue, nasal congestion, chest tightness, dizziness, nausea, lower back/left flank pains, SOB upon exertion, and chest pain with deep breaths.   The cheat pain is sharp, tight, and uncomfortable. The pain has been in her upper mid chest and bilateral rib cage regions.   She used Peppermint tea, Dayquil, Zyrtec, and Breathing tx which did not help.
